# Patient Record
Sex: Female | Born: 1955 | Race: White | Hispanic: No | Marital: Married | Smoking: Never smoker
Health system: Southern US, Community
[De-identification: ages and names within clinical notes are randomized; demographics above are authoritative.]

## PROBLEM LIST (undated history)

## (undated) DIAGNOSIS — I10 Essential (primary) hypertension: Secondary | ICD-10-CM

## (undated) DIAGNOSIS — B0230 Zoster ocular disease, unspecified: Secondary | ICD-10-CM

## (undated) DIAGNOSIS — K5730 Diverticulosis of large intestine without perforation or abscess without bleeding: Secondary | ICD-10-CM

## (undated) HISTORY — DX: Essential (primary) hypertension: I10

## (undated) HISTORY — DX: Zoster ocular disease, unspecified: B02.30

## (undated) HISTORY — DX: Diverticulosis of large intestine without perforation or abscess without bleeding: K57.30

---

## 1990-06-19 HISTORY — PX: BREAST BIOPSY: SHX20

## 1992-06-19 HISTORY — PX: UMBILICAL HERNIA REPAIR: SHX196

## 2009-09-02 ENCOUNTER — Encounter: Admission: RE | Admit: 2009-09-02 | Discharge: 2009-09-02 | Payer: Self-pay | Admitting: Family Medicine

## 2011-03-01 LAB — HM PAP SMEAR

## 2011-03-03 ENCOUNTER — Encounter: Payer: Self-pay | Admitting: Internal Medicine

## 2011-04-04 ENCOUNTER — Other Ambulatory Visit: Payer: Self-pay | Admitting: Family Medicine

## 2011-04-04 DIAGNOSIS — Z1231 Encounter for screening mammogram for malignant neoplasm of breast: Secondary | ICD-10-CM

## 2011-04-13 ENCOUNTER — Other Ambulatory Visit: Payer: Self-pay | Admitting: Internal Medicine

## 2011-04-14 ENCOUNTER — Ambulatory Visit
Admission: RE | Admit: 2011-04-14 | Discharge: 2011-04-14 | Disposition: A | Discharge status: Home / Self Care | Payer: PRIVATE HEALTH INSURANCE | Source: Ambulatory Visit | Attending: Family Medicine | Admitting: Family Medicine

## 2011-04-14 ENCOUNTER — Encounter: Payer: Self-pay | Admitting: Internal Medicine

## 2011-04-14 DIAGNOSIS — Z1231 Encounter for screening mammogram for malignant neoplasm of breast: Secondary | ICD-10-CM

## 2011-04-14 LAB — HM MAMMOGRAPHY

## 2011-04-21 ENCOUNTER — Other Ambulatory Visit: Payer: Self-pay | Admitting: Internal Medicine

## 2011-06-05 ENCOUNTER — Other Ambulatory Visit: Payer: Self-pay | Admitting: Internal Medicine

## 2011-06-20 DIAGNOSIS — K5730 Diverticulosis of large intestine without perforation or abscess without bleeding: Secondary | ICD-10-CM

## 2011-06-20 HISTORY — DX: Diverticulosis of large intestine without perforation or abscess without bleeding: K57.30

## 2011-06-26 ENCOUNTER — Ambulatory Visit (AMBULATORY_SURGERY_CENTER): Payer: PRIVATE HEALTH INSURANCE | Admitting: *Deleted

## 2011-06-26 VITALS — Ht 64.0 in | Wt 187.3 lb

## 2011-06-26 DIAGNOSIS — Z1211 Encounter for screening for malignant neoplasm of colon: Secondary | ICD-10-CM

## 2011-06-26 MED ORDER — PEG-KCL-NACL-NASULF-NA ASC-C 100 G PO SOLR: ORAL | Status: DC

## 2011-07-14 ENCOUNTER — Encounter: Payer: Self-pay | Admitting: Internal Medicine

## 2011-07-14 ENCOUNTER — Ambulatory Visit (AMBULATORY_SURGERY_CENTER): Payer: PRIVATE HEALTH INSURANCE | Admitting: Internal Medicine

## 2011-07-14 VITALS — BP 129/87 | HR 66 | Temp 97.6°F | Resp 18 | Ht 64.0 in | Wt 187.0 lb

## 2011-07-14 DIAGNOSIS — Z1211 Encounter for screening for malignant neoplasm of colon: Secondary | ICD-10-CM

## 2011-07-14 MED ORDER — SODIUM CHLORIDE 0.9 % IV SOLN: 500.0000 mL | INTRAVENOUS | Status: DC

## 2011-07-14 NOTE — Progress Notes (Signed)
Patient did not have preoperative order for IV antibiotic SSI prophylaxis. (G8918)  Patient did not experience any of the following events: a burn prior to discharge; a fall within the facility; wrong site/side/patient/procedure/implant event; or a hospital transfer or hospital admission upon discharge from the facility. (G8907)  

## 2011-07-14 NOTE — Patient Instructions (Signed)
FOLLOW THE INSTRUCTIONS ON THE BLUE AND GREEN DISCHARGE INSTRUCTION SHEETS.  CONTINUE YOUR MEDICATIONS.  HIGH FIBER DIET.

## 2011-07-14 NOTE — Progress Notes (Signed)
Pressure applied to the abdomen to reach the cecum 

## 2011-07-14 NOTE — Op Note (Signed)
[facility] [address] [place], [place]  [address]  COLONOSCOPY PROCEDURE REPORT  PATIENT:  Caitlin Walters, Caitlin Walters  MR#:  [id number] BIRTHDATE:  [birth year], 55 yrs. old  GENDER:  female ENDOSCOPIST:  Hedwig Morton. Juanda Chance, MD REF. BY: PROCEDURE DATE:  07/14/2011 PROCEDURE:  Colonoscopy 19147 ASA CLASS:  Class I INDICATIONS:  colorectal cancer screening, average risk MEDICATIONS:   These medications were titrated to patient response per physician's verbal order, Versed 7 mg, Fentanyl 75 mcg  DESCRIPTION OF PROCEDURE:   After the risks and benefits and of the procedure were explained, informed consent was obtained. Digital rectal exam was performed and revealed no rectal masses. The LB [id number]-[id number] endoscope was introduced through the anus and advanced to the cecum, which was identified by both the appendix and ileocecal valve.  The quality of the prep was good, using MoviPrep.  The instrument was then slowly withdrawn as the colon was fully examined. <<PROCEDUREIMAGES>>  FINDINGS:  Mild diverticulosis was found in the sigmoid colon (see image1).  This was otherwise a normal examination of the colon (see image5, image4, image3, and image2).   Retroflexed views in the rectum revealed no abnormalities.    The scope was then withdrawn from the patient and the procedure completed.  COMPLICATIONS:  None ENDOSCOPIC IMPRESSION: 1) Mild diverticulosis in the sigmoid colon 2) Otherwise normal examination RECOMMENDATIONS: 1) High fiber diet.  REPEAT EXAM:  In 10 year(s) for.  ______________________________ Hedwig Morton. Juanda Chance, MD  CC:  n. eSIGNED:   Hedwig Morton. Jhoselyn Ruffini at 07/14/2011 08:34 AM  Heron Sabins, [phone]

## 2011-07-17 ENCOUNTER — Telehealth: Payer: Self-pay

## 2011-07-17 NOTE — Telephone Encounter (Signed)
Receiver hung up on RN twice

## 2012-11-06 ENCOUNTER — Other Ambulatory Visit: Payer: Self-pay

## 2012-11-06 DIAGNOSIS — Z1231 Encounter for screening mammogram for malignant neoplasm of breast: Secondary | ICD-10-CM

## 2012-11-18 ENCOUNTER — Ambulatory Visit
Admission: RE | Admit: 2012-11-18 | Discharge: 2012-11-18 | Disposition: A | Discharge status: Home / Self Care | Payer: PRIVATE HEALTH INSURANCE | Source: Ambulatory Visit

## 2012-11-18 DIAGNOSIS — Z1231 Encounter for screening mammogram for malignant neoplasm of breast: Secondary | ICD-10-CM

## 2012-11-18 LAB — HM MAMMOGRAPHY

## 2012-11-19 ENCOUNTER — Ambulatory Visit (INDEPENDENT_AMBULATORY_CARE_PROVIDER_SITE_OTHER): Payer: PRIVATE HEALTH INSURANCE | Admitting: Nurse Practitioner

## 2012-11-19 ENCOUNTER — Encounter: Payer: Self-pay | Admitting: Nurse Practitioner

## 2012-11-19 ENCOUNTER — Encounter: Payer: Self-pay | Admitting: *Deleted

## 2012-11-19 VITALS — BP 120/80 | HR 73 | Temp 98.0°F | Ht 63.16 in | Wt 187.2 lb

## 2012-11-19 DIAGNOSIS — Z0000 Encounter for general adult medical examination without abnormal findings: Secondary | ICD-10-CM

## 2012-11-19 DIAGNOSIS — Z7689 Persons encountering health services in other specified circumstances: Secondary | ICD-10-CM

## 2012-11-19 DIAGNOSIS — Z7189 Other specified counseling: Secondary | ICD-10-CM

## 2012-11-19 DIAGNOSIS — Z6832 Body mass index (BMI) 32.0-32.9, adult: Secondary | ICD-10-CM

## 2012-11-19 DIAGNOSIS — R319 Hematuria, unspecified: Secondary | ICD-10-CM

## 2012-11-19 DIAGNOSIS — F329 Major depressive disorder, single episode, unspecified: Secondary | ICD-10-CM | POA: Insufficient documentation

## 2012-11-19 DIAGNOSIS — F4321 Adjustment disorder with depressed mood: Secondary | ICD-10-CM

## 2012-11-19 LAB — POCT URINALYSIS DIPSTICK
Bilirubin, UA: NEGATIVE
Glucose, UA: NEGATIVE
Protein, UA: NEGATIVE

## 2012-11-19 LAB — RENAL FUNCTION PANEL
Creatinine, Ser: 0.7 mg/dL (ref 0.4–1.2)
GFR: 96.79 mL/min (ref >60.00)
Glucose, Bld: 81 mg/dL (ref 70–99)
Phosphorus: 3.8 mg/dL (ref 2.3–4.6)
Sodium: 141 mEq/L (ref 135–145)

## 2012-11-19 LAB — CBC
Hemoglobin: 13.9 g/dL (ref 12.0–15.0)
MCHC: 33.5 g/dL (ref 30.0–36.0)
Platelets: 303 10*3/uL (ref 150.0–400.0)

## 2012-11-19 LAB — LIPID PANEL
LDL Cholesterol: 103 mg/dL — ABNORMAL HIGH (ref 0–99)
Total CHOL/HDL Ratio: 4
Triglycerides: 87 mg/dL (ref 0.0–149.0)
VLDL: 17.4 mg/dL (ref 0.0–40.0)

## 2012-11-19 LAB — HEPATIC FUNCTION PANEL
ALT: 36 U/L — ABNORMAL HIGH (ref 0–35)
AST: 36 U/L (ref 0–37)
Alkaline Phosphatase: 63 U/L (ref 39–117)
Total Bilirubin: 0.5 mg/dL (ref 0.3–1.2)

## 2012-11-19 NOTE — Patient Instructions (Addendum)
We have talked about many things today. We will focus on weight loss through increasing activity and diet: activity goal is 30 minute walk at least 5 days weekly. Diet goal is 1600 calories daily, It is ok to cheat every now & then, but no more than 2100 calories. Make sure you walk on the days you cheat! This plan should drop 1-2 pounds each week.  Regarding situational stress,I think exercise will help and you have resources available for "cognitive behavioral therapy" where you can learn coping skills for stressful events/relationships. I will call you with abnormal labs results. Make an appointment regarding weight loss-if things are not going as planned, or if you want to discuss anxiety/situational depression.  Pleasure to meet you! Come back in September for flu vaccines. You may also get the Tdap & shingles vaccine at that time, if you wish.

## 2012-11-19 NOTE — Progress Notes (Signed)
Patient ID: Caitlin Walters, female   DOB: [birth year], 56 y.o.   MRN: [id number] Subjective:     Caitlin Walters is a 57 y.o. female and is here for a comprehensive physical exam. The patient reports 30 lb. weight gain, situational depression related to family circumstances..  History   Social History  . Marital Status: Married    Spouse Name: Caitlin Walters    Number of Children: 2  . 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 Smoking status: Never Smoker   . Smokeless tobacco: Never Used  . Alcohol Use: No  . Drug Use: No  . Sexually Active: Yes    Birth Control/ Protection: Post-menopausal   Other Topics Concern  . 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 They are from [place], but lived in [place] for long while, moved back to [place] to help son's family with children & wife's illness (brain CA). Son & daughter-in-law divorced 1 year ago, which has caused tremendous grief in family.    Health Maintenance  Topic Date Due  . Pap Smear  12/05/1974  . Tetanus/tdap  12/05/1975  . Influenza Vaccine  02/17/2013  . Mammogram  11/19/2014  . Colonoscopy  07/13/2021    The following portions of the patient's history were reviewed and updated as appropriate: allergies, current medications, past family history, past medical history, past social history, past surgical history and problem list.  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 Gastrointestinal: negative, most recent colonoscopy shows mild diverticulosis. pt has not had any symptoms Behavioral/Psych: negative, pt reports feelings of sadness and the desire to be reclusive. She thinks this is r/t her son's divorce 1 year ago.    Objective:    BP 120/80  Pulse 73  Temp(Src) 98 F  (36.7 C) (Oral)  Ht 5' 3.16" (1.604 m)  Wt 187 lb 4 oz (84.936 kg)  BMI 33.01 kg/m2  SpO2 97% General appearance: alert, cooperative, appears stated age and no distress Head: Normocephalic, without obvious abnormality, atraumatic Eyes: negative findings: lids and lashes normal, corneas clear and pupils equal, round, reactive to light and accomodation, positive findings: conjunctiva: L eye injection Ears: normal TM's and external ear canals both ears Throat: lips, mucosa, and tongue normal; teeth and gums normal and torus palatinus  Neck: no adenopathy, supple, symmetrical, trachea midline and thyroid not enlarged, symmetric, no tenderness/mass/nodules Lungs: clear to auscultation bilaterally Heart: regular rate and rhythm, S1, S2 normal, no murmur, click, rub or gallop and normal apical impulse Extremities: extremities normal, atraumatic, no cyanosis or edema Pulses: 2+ and symmetric Lymph nodes: Cervical, supraclavicular, and axillary nodes normal.    Assessment:   BMI 32.0-32.9,adult - Plan: Lipid panel, TSH, CBC, Hepatic function panel, Renal function panel, POCT urinalysis dipstick  Establishing care with new doctor, encounter for  Situational depression    Plan:    1. See plan for weight loss in pt instructions 2. Will request medical records to determine vaccine needs. Last PAP  2013-normal per pt report. 3.Pt has friend who is behavioral therapist, also friends in ministry who she confides with. Plan: if these resources and exercise are not helpful, we will discuss adding an ssri/snri.  [email or web address] See After Visit Summary for Counseling Recommendations

## 2012-11-26 LAB — CBC WITH DIFFERENTIAL/PLATELET
BASO%: 1 %
EOS%: 4 %
Eosinophils Absolute: 0 /uL
Granulocytes:: 51
HCT: 41 %
LYMPH: 37 %
Lymphocytes absolute: 2.1 10*3/uL — AB (ref 0.1–1.8)
MONO: 7 /uL
Monocyes absolute: 0.4 10*3/uL (ref 0.1–1)

## 2012-11-26 LAB — COMPLETE METABOLIC PANEL WITH GFR
Albumin: 4.5
Alkaline Phosphatase: 84 U/L
CO2: 27 mmol/L
Chloride: 104 mmol/L
Creatinine: 0.7
Glucose: 99 mg/dL
Potassium: 4.6 mmol/L
Total Bilirubin: 0.5 mg/dL

## 2012-11-26 LAB — LIPID PANEL
HDL: 54 mg/dL (ref 35–70)
LDL Calculated: 174 mg/dL
TSH: 1.312
Triglycerides: 123

## 2013-04-24 ENCOUNTER — Other Ambulatory Visit: Payer: Self-pay

## 2013-08-08 ENCOUNTER — Ambulatory Visit (INDEPENDENT_AMBULATORY_CARE_PROVIDER_SITE_OTHER): Payer: PRIVATE HEALTH INSURANCE | Admitting: Nurse Practitioner

## 2013-08-08 ENCOUNTER — Encounter: Payer: Self-pay | Admitting: Nurse Practitioner

## 2013-08-08 VITALS — BP 137/85 | HR 80 | Temp 98.9°F | Resp 16 | Ht 63.16 in | Wt 193.0 lb

## 2013-08-08 DIAGNOSIS — R030 Elevated blood-pressure reading, without diagnosis of hypertension: Secondary | ICD-10-CM

## 2013-08-08 DIAGNOSIS — F4321 Adjustment disorder with depressed mood: Secondary | ICD-10-CM

## 2013-08-08 DIAGNOSIS — IMO0001 Reserved for inherently not codable concepts without codable children: Secondary | ICD-10-CM

## 2013-08-08 MED ORDER — LISINOPRIL 10 MG PO TABS: 10.0000 mg | ORAL_TABLET | Freq: Every day | ORAL | Status: DC

## 2013-08-08 NOTE — Progress Notes (Signed)
Pre visit review using our clinic review tool, if applicable. No additional management support is needed unless otherwise documented below in the visit note. 

## 2013-08-08 NOTE — Patient Instructions (Signed)
Stop hydrochlorothiazide & potassium supplement. Start lisinopril tomorrow. If not affordable, please call office.  Read about the DASH diet for healthy eating tips-it helps w/blood pressure & weight loss.  F/u in 1 week for bp check & eval meds. So nice to see you!  DASH Diet The DASH diet stands for "Dietary Approaches to Stop Hypertension." It is a healthy eating plan that has been shown to reduce high blood pressure (hypertension) in as little as 14 days, while also possibly providing other significant health benefits. These other health benefits include reducing the risk of breast cancer after menopause and reducing the risk of type 2 diabetes, heart disease, colon cancer, and stroke. Health benefits also include weight loss and slowing kidney failure in patients with chronic kidney disease.  DIET GUIDELINES  Limit salt (sodium). Your diet should contain less than 1500 mg of sodium daily.  Limit refined or processed carbohydrates. Your diet should include mostly whole grains. Desserts and added sugars should be used sparingly.  Include small amounts of heart-healthy fats. These types of fats include nuts, oils, and tub margarine. Limit saturated and trans fats. These fats have been shown to be harmful in the body. CHOOSING FOODS  The following food groups are based on a 2000 calorie diet. See your Registered Dietitian for individual calorie needs. Grains and Grain Products (6 to 8 servings daily)  Eat More Often: Whole-wheat bread, brown rice, whole-grain or wheat pasta, quinoa, popcorn without added fat or salt (air popped).  Eat Less Often: White bread, white pasta, white rice, cornbread. Vegetables (4 to 5 servings daily)  Eat More Often: Fresh, frozen, and canned vegetables. Vegetables may be raw, steamed, roasted, or grilled with a minimal amount of fat.  Eat Less Often/Avoid: Creamed or fried vegetables. Vegetables in a cheese sauce. Fruit (4 to 5 servings daily)  Eat More  Often: All fresh, canned (in natural juice), or frozen fruits. Dried fruits without added sugar. One hundred percent fruit juice ( cup [237 mL] daily).  Eat Less Often: Dried fruits with added sugar. Canned fruit in light or heavy syrup. Foot LockerLean Meats, Fish, and Poultry (2 servings or less daily. One serving is 3 to 4 oz [85-114 g]).  Eat More Often: Ninety percent or leaner ground beef, tenderloin, sirloin. Round cuts of beef, chicken breast, Malaw[place]urkey breast. All fish. Grill, bake, or broil your meat. Nothing should be fried.  Eat Less Often/Avoid: Fatty cuts of meat, Malaw[place]key, or chicken leg, thigh, or wing. Fried cuts of meat or fish. Dairy (2 to 3 servings)  Eat More Often: Low-fat or fat-free milk, low-fat plain or light yogurt, reduced-fat or part-skim cheese.  Eat Less Often/Avoid: Milk (whole, 2%).Whole milk yogurt. Full-fat cheeses. Nuts, Seeds, and Legumes (4 to 5 servings per week)  Eat More Often: All without added salt.  Eat Less Often/Avoid: Salted nuts and seeds, canned beans with added salt. Fats and Sweets (limited)  Eat More Often: Vegetable oils, tub margarines without trans fats, sugar-free gelatin. Mayonnaise and salad dressings.  Eat Less Often/Avoid: Coconut oils, palm oils, butter, stick margarine, cream, half and half, cookies, candy, pie. FOR MORE INFORMATION The Dash Diet Eating Plan: www.dashdiet.org Document Released: 05/25/2011 Document Revised: 08/28/2011 Document Reviewed: 05/25/2011 [facility] Patient Information 2014 [place], [place].

## 2013-08-11 ENCOUNTER — Telehealth: Payer: Self-pay | Admitting: Nurse Practitioner

## 2013-08-11 DIAGNOSIS — R030 Elevated blood-pressure reading, without diagnosis of hypertension: Principal | ICD-10-CM

## 2013-08-11 DIAGNOSIS — IMO0001 Reserved for inherently not codable concepts without codable children: Secondary | ICD-10-CM | POA: Insufficient documentation

## 2013-08-11 NOTE — Progress Notes (Signed)
Subjective:    Patient here for hospital follow-up. I reviewed [facility] hospital record including labs, CXR, echocardiogram, stress test.  Dx was hypokalemia. Pt experienced palpitations-thought she was having a heart attack. [facility] K was 3.1. Corrected w/oral potassium. Cardiac enzymes neg, ECG showed SR w/long QT, echo nml, exercise stress test nml sr, but she had resting hypertension when activity was stopped.  Pt was seen for first time in ofc 11/2012 to establish care and reported several stressful life events but did not want to start medication (SSRI). She was not hypertensive, nor was she on HCTZ. Pt thinks she got the medicine at our office, but there is no script nor reference.     She reports stress in recent months with her mother falling & coming to live with her, then going back home; & some job/financial concerns.    The following portions of the patient's history were reviewed and updated as appropriate: allergies, current medications, past medical history, past social history, past surgical history and problem list.  Review of Systems Constitutional: negative for chills, fatigue and fevers Respiratory: negative for cough Cardiovascular: all symptoms resolved sonce potassium corrected. Behavioral/Psych: positive for depression     Objective:    BP 137/85  Pulse 80  Temp(Src) 98.9 F (37.2 C) (Temporal)  Resp 16  Ht 5' 3.16" (1.604 m)  Wt 193 lb (87.544 kg)  BMI 34.03 kg/m2  SpO2 95% General appearance: alert, cooperative, appears stated age and no distress Head: Normocephalic, without obvious abnormality, atraumatic Eyes: negative findings: lids and lashes normal and conjunctivae and sclerae normal Lungs: clear to auscultation bilaterally Heart: regular rate and rhythm, S1, S2 normal, no murmur, click, rub or gallop    Assessment & Plan:  1 Elevated blood pressure: fair control on HCTZ. Recent hospitalization for hypokalemia (NSR w/long QT) resolved with potassium  restoration. Resting hypertension after cardiac stress test.  D/C HCTZ. Start lisinopril 10 mg qd. F/u in 10 days. Pt planning to exercise several days/week.  2 Situational depression Pt wants to try exercise. If no relief will consider low dose fluoxetine.

## 2013-08-11 NOTE — Telephone Encounter (Signed)
Relevant patient education assigned to patient using Emmi. ° °

## 2013-08-11 NOTE — Assessment & Plan Note (Signed)
Pt hoping to feel better with exercise. Will consider fluoxetine if no relief.

## 2013-08-19 ENCOUNTER — Ambulatory Visit (INDEPENDENT_AMBULATORY_CARE_PROVIDER_SITE_OTHER): Payer: PRIVATE HEALTH INSURANCE | Admitting: Nurse Practitioner

## 2013-08-19 ENCOUNTER — Encounter: Payer: Self-pay | Admitting: Nurse Practitioner

## 2013-08-19 VITALS — BP 130/70 | HR 67 | Temp 98.0°F | Ht 63.16 in | Wt 192.5 lb

## 2013-08-19 DIAGNOSIS — F4321 Adjustment disorder with depressed mood: Secondary | ICD-10-CM

## 2013-08-19 DIAGNOSIS — Z6832 Body mass index (BMI) 32.0-32.9, adult: Secondary | ICD-10-CM

## 2013-08-19 DIAGNOSIS — I10 Essential (primary) hypertension: Secondary | ICD-10-CM

## 2013-08-19 DIAGNOSIS — R030 Elevated blood-pressure reading, without diagnosis of hypertension: Secondary | ICD-10-CM

## 2013-08-19 DIAGNOSIS — IMO0001 Reserved for inherently not codable concepts without codable children: Secondary | ICD-10-CM

## 2013-08-19 DIAGNOSIS — Z130 Encounter for screening for diseases of the blood and blood-forming organs and certain disorders involving the immune mechanism: Secondary | ICD-10-CM

## 2013-08-19 DIAGNOSIS — Z1321 Encounter for screening for nutritional disorder: Secondary | ICD-10-CM

## 2013-08-19 DIAGNOSIS — Z13228 Encounter for screening for other metabolic disorders: Secondary | ICD-10-CM

## 2013-08-19 DIAGNOSIS — Z1329 Encounter for screening for other suspected endocrine disorder: Secondary | ICD-10-CM

## 2013-08-19 LAB — BASIC METABOLIC PANEL
BUN: 15 mg/dL (ref 6–23)
CO2: 28 mEq/L (ref 19–32)
Calcium: 9.4 mg/dL (ref 8.4–10.5)
Chloride: 103 mEq/L (ref 96–112)
Creatinine, Ser: 0.7 mg/dL (ref 0.4–1.2)
GFR: 99.96 mL/min (ref >60.00)
GLUCOSE: 78 mg/dL (ref 70–99)
POTASSIUM: 4.4 meq/L (ref 3.5–5.1)
SODIUM: 137 meq/L (ref 135–145)

## 2013-08-19 NOTE — Patient Instructions (Signed)
I am not sure if cough is related to lisinopril. Start daily sinus rinses (Neilmed) to see if symptoms may be allergy related. Continue medication for 3-4 more weeks & see if symptoms improve. If not, call office & we can discuss changing med. Our office will call regarding labs today. Great to see you! Keep exercising for best health.  Managing Your High Blood Pressure Blood pressure is a measurement of how forceful your blood is pressing against the walls of the arteries. Arteries are muscular tubes within the circulatory system. Blood pressure does not stay the same. Blood pressure rises when you are active, excited, or nervous; and it lowers during sleep and relaxation. If the numbers measuring your blood pressure stay above normal most of the time, you are at risk for health problems. High blood pressure (hypertension) is a long-term (chronic) condition in which blood pressure is elevated. A blood pressure reading is recorded as two numbers, such as 120 over 80 (or 120/80). The first, higher number is called the systolic pressure. It is a measure of the pressure in your arteries as the heart beats. The second, lower number is called the diastolic pressure. It is a measure of the pressure in your arteries as the heart relaxes between beats.  Keeping your blood pressure in a normal range is important to your overall health and prevention of health problems, such as heart disease and stroke. When your blood pressure is uncontrolled, your heart has to work harder than normal. High blood pressure is a very common condition in adults because blood pressure tends to rise with age. Men and women are equally likely to have hypertension but at different times in life. Before age 57, men are more likely to have hypertension. After 57 years of age, women are more likely to have it. Hypertension is especially common in African Americans. This condition often has no signs or symptoms. The cause of the condition is  usually not known. Your caregiver can help you come up with a plan to keep your blood pressure in a normal, healthy range. BLOOD PRESSURE STAGES Blood pressure is classified into four stages: normal, prehypertension, stage 1, and stage 2. Your blood pressure reading will be used to determine what type of treatment, if any, is necessary. Appropriate treatment options are tied to these four stages:  Normal  Systolic pressure (mm Hg): below 120.  Diastolic pressure (mm Hg): below 80. Prehypertension  Systolic pressure (mm Hg): 120 to 139.  Diastolic pressure (mm Hg): 80 to 89. Stage1  Systolic pressure (mm Hg): 140 to 159.  Diastolic pressure (mm Hg): 90 to 99. Stage2  Systolic pressure (mm Hg): 160 or above.  Diastolic pressure (mm Hg): 100 or above. RISKS RELATED TO HIGH BLOOD PRESSURE Managing your blood pressure is an important responsibility. Uncontrolled high blood pressure can lead to:  A heart attack.  A stroke.  A weakened blood vessel (aneurysm).  Heart failure.  Kidney damage.  Eye damage.  Metabolic syndrome.  Memory and concentration problems. HOW TO MANAGE YOUR BLOOD PRESSURE Blood pressure can be managed effectively with lifestyle changes and medicines (if needed). Your caregiver will help you come up with a plan to bring your blood pressure within a normal range. Your plan should include the following: Education  Read all information provided by your caregivers about how to control blood pressure.  Educate yourself on the latest guidelines and treatment recommendations. New research is always being done to further define the risks and treatments for  high blood pressure. Lifestylechanges  Control your weight.  Avoid smoking.  Stay physically active.  Reduce the amount of salt in your diet.  Reduce stress.  Control any chronic conditions, such as high cholesterol or diabetes.  Reduce your alcohol intake. Medicines  Several medicines  (antihypertensive medicines) are available, if needed, to bring blood pressure within a normal range. Communication  Review all the medicines you take with your caregiver because there may be side effects or interactions.  Talk with your caregiver about your diet, exercise habits, and other lifestyle factors that may be contributing to high blood pressure.  See your caregiver regularly. Your caregiver can help you create and adjust your plan for managing high blood pressure. RECOMMENDATIONS FOR TREATMENT AND FOLLOW-UP  The following recommendations are based on current guidelines for managing high blood pressure in nonpregnant adults. Use these recommendations to identify the proper follow-up period or treatment option based on your blood pressure reading. You can discuss these options with your caregiver.  Systolic pressure of 120 to 139 or diastolic pressure of 80 to 89: Follow up with your caregiver as directed.  Systolic pressure of 140 to 160 or diastolic pressure of 90 to 100: Follow up with your caregiver within 2 months.  Systolic pressure above 160 or diastolic pressure above 100: Follow up with your caregiver within 1 month.  Systolic pressure above 180 or diastolic pressure above 110: Consider antihypertensive therapy; follow up with your caregiver within 1 week.  Systolic pressure above 200 or diastolic pressure above 120: Begin antihypertensive therapy; follow up with your caregiver within 1 week. Document Released: 02/28/2012 Document Reviewed: 02/28/2012 [facility] Patient Information 2014 [place], [place].

## 2013-08-19 NOTE — Progress Notes (Signed)
Pre visit review using our clinic review tool, if applicable. No additional management support is needed unless otherwise documented below in the visit note. 

## 2013-08-20 ENCOUNTER — Telehealth: Payer: Self-pay | Admitting: Nurse Practitioner

## 2013-08-20 LAB — VITAMIN D 25 HYDROXY (VIT D DEFICIENCY, FRACTURES): Vit D, 25-Hydroxy: 47 ng/mL (ref 30–89)

## 2013-08-20 NOTE — Telephone Encounter (Signed)
Relevant patient education assigned to patient using Emmi. ° °

## 2013-08-22 NOTE — Assessment & Plan Note (Signed)
D/c HCTZ due to hyperkalemia & persistent elevated bp [facility]  Started lisinopril 10 mg qd.  Pt returns 1 wk later. BP well controlled. Tolerating med well. Will continue.  BMET today.

## 2013-08-22 NOTE — Assessment & Plan Note (Addendum)
Lost 1 lb. In 1 wk w/exercise. Will continue.

## 2013-08-22 NOTE — Progress Notes (Signed)
Subjective:    Patient here for follow-up of elevated blood pressure. She presented 1 weeks ago [facility]. Pt was taking 12.5 mg HCTZ prescribed by historical provider. [facility] she had persistent BP elevation & HR. I D?C HCTZ & started lisinopril.  She has started exercise program at [employer]. Blood pressure is well controlled at home. Cardiac symptoms: none. Patient denies: chest pain, fatigue, irregular heart beat, lower extremity edema, near-syncope and palpitations. Cardiovascular risk factors: family history of premature cardiovascular disease, hypertension, obesity (BMI >= 30 kg/m2) and sedentary lifestyle. Use of agents associated with hypertension: none. History of target organ damage: none.  The following portions of the patient's history were reviewed and updated as appropriate: allergies, current medications, past family history, past medical history, past social history, past surgical history and problem list.  Review of Systems Constitutional: negative Eyes: negative for visual disturbance Respiratory: negative for cough and wheezing Cardiovascular: positive for cp when potassium was low "thought I was having heart attaack", negative for fatigue, lower extremity edema, near-syncope and palpitations Behavioral/Psych: positive for depression, negative for decreased appetite, excessive alcohol consumption, illegal drug usage and tobacco use     Objective:    BP 130/70  Pulse 67  Temp(Src) 98 F (36.7 C) (Oral)  Ht 5' 3.16" (1.604 m)  Wt 192 lb 8 oz (87.317 kg)  BMI 33.94 kg/m2  SpO2 97% General appearance: alert, cooperative, appears stated age and no distress Head: Normocephalic, without obvious abnormality, atraumatic Eyes: negative findings: lids and lashes normal and conjunctivae and sclerae normal Lungs: clear to auscultation bilaterally Heart: regular rate and rhythm, S1, S2 normal, no murmur, click, rub or  gallop Extremities: extremities normal, atraumatic, no cyanosis or edema Pulses: 2+ and symmetric    Assessment:    Hypertension, normal blood pressure . Evidence of target organ damage: none.   situational depression BMI >30: lost 1 lb/wk Prev care; Vit D screen Plan:   1 continue lisinopril 10 mg. bmet today. Cont exercise 2 exercise 3 exercise 4 vit d today See prob list for A&P details.

## 2013-08-22 NOTE — Assessment & Plan Note (Signed)
Does not wish to use medication. Wants to use exercise for mood elevation & coping with stress. Will let me know if not feeling better.

## 2013-10-09 LAB — LIPID PANEL
Cholesterol, Total: 221
HDL: 55 mg/dL (ref 35–70)
LDL CALC: 135
MAGNESIUM: 2 mg/dL (ref 1.6–2.4)
Triglycerides: 154

## 2013-10-09 LAB — BASIC METABOLIC PANEL
Anion gap: 11
BUN/Creatinine Ratio: 22
BUN: 13 mg/dL (ref 4–21)
CO2: 27 mmol/L
Calcium: 9 mg/dL
Chloride: 102 mmol/L
Creat: 0.6
EGFR: 60 mg/dL
Glucose: 101
Potassium: 3.7 mmol/L
SODIUM: 136 mmol/L — AB (ref 137–147)

## 2013-10-09 LAB — CBC WITH DIFFERENTIAL/PLATELET
BASOPHIL: 0.05
BASOPHILS PERCENT AUTO: 0.6
Eosinophil Count, Nasal: 0.23
Eosinophil percent: 2.6
HCT: 0.44
Hemoglobin: 15.2 g/dL (ref 11.8–15.5)
Lymphocyte count, blood: 2.8
Lymphocytes relative %: 32.1 % (ref 15–45)
MCH: 30
MCHC: 34.4
MCV: 87 fL (ref 78–100)
MONOCYTES RELATIVE % (KUC): 6.1 % (ref 2–10)
MPV (KUC): 9.9 fL (ref 7.8–11)
Manual nRBC per 100 Cells: 0
Monocyte Count, blood: 0.5
Neutrophils absolute (GR#): 5.1 10*3/uL (ref 1.7–7.8)
Neutrophils relative % (GR): 58.6 % (ref 44–76)
PLATELET COUNT (KUC): 353 10*3/uL (ref 140–400)
RBC: 5.06
RDW: 13.7
WBC: 8.7
nRBC#: 0

## 2013-10-09 LAB — CHG BLOOD CLOT FACTOR X TEST
CK MB: 2 (ref 0.0–3.5)
CK Total: 59
Potassium: 3.1 mmol/L
Troponin T: 0.01 ng/mL

## 2013-10-23 ENCOUNTER — Ambulatory Visit (INDEPENDENT_AMBULATORY_CARE_PROVIDER_SITE_OTHER): Payer: Self-pay | Admitting: Nurse Practitioner

## 2013-10-23 ENCOUNTER — Encounter: Payer: Self-pay | Admitting: Nurse Practitioner

## 2013-10-23 ENCOUNTER — Other Ambulatory Visit (HOSPITAL_COMMUNITY)
Admission: RE | Admit: 2013-10-23 | Discharge: 2013-10-23 | Disposition: A | Discharge status: Home / Self Care | Payer: PRIVATE HEALTH INSURANCE | Source: Ambulatory Visit | Attending: Nurse Practitioner | Admitting: Nurse Practitioner

## 2013-10-23 ENCOUNTER — Telehealth: Payer: Self-pay | Admitting: Nurse Practitioner

## 2013-10-23 VITALS — BP 128/84 | HR 76 | Temp 97.6°F | Ht 63.16 in | Wt 191.0 lb

## 2013-10-23 DIAGNOSIS — Z01419 Encounter for gynecological examination (general) (routine) without abnormal findings: Secondary | ICD-10-CM | POA: Insufficient documentation

## 2013-10-23 DIAGNOSIS — I10 Essential (primary) hypertension: Secondary | ICD-10-CM

## 2013-10-23 DIAGNOSIS — F4323 Adjustment disorder with mixed anxiety and depressed mood: Secondary | ICD-10-CM

## 2013-10-23 DIAGNOSIS — N8111 Cystocele, midline: Secondary | ICD-10-CM

## 2013-10-23 DIAGNOSIS — IMO0002 Reserved for concepts with insufficient information to code with codable children: Secondary | ICD-10-CM

## 2013-10-23 DIAGNOSIS — F4321 Adjustment disorder with depressed mood: Secondary | ICD-10-CM

## 2013-10-23 DIAGNOSIS — IMO0001 Reserved for inherently not codable concepts without codable children: Secondary | ICD-10-CM

## 2013-10-23 DIAGNOSIS — Z1151 Encounter for screening for human papillomavirus (HPV): Secondary | ICD-10-CM | POA: Insufficient documentation

## 2013-10-23 DIAGNOSIS — R030 Elevated blood-pressure reading, without diagnosis of hypertension: Secondary | ICD-10-CM

## 2013-10-23 MED ORDER — PROPRANOLOL HCL 20 MG PO TABS: 40.0000 mg | ORAL_TABLET | Freq: Every day | ORAL | Status: DC

## 2013-10-23 NOTE — Patient Instructions (Addendum)
Start new Blood pressure medication. It may help with anxiety as well. Return in 2 weeks to see how you are tolerating. You have mild bladder prolapse. Empty bladder every 2 hours to prevent bladder distension. Kegel kegel kegel!! Hold 10 sec, repeat 10 times, 10 times daily. Propranolol tablets What is this medicine? PROPRANOLOL (proe PRAN oh lole) is a beta-blocker. Beta-blockers reduce the workload on the heart and help it to beat more regularly. This medicine is used to treat high blood pressure, to control irregular heart rhythms (arrhythmias) and to relieve chest pain caused by angina. It may also be helpful after a heart attack. This medicine is also used to prevent migraine headaches, relieve uncontrollable shaking (tremors), and help certain problems related to the thyroid gland and adrenal gland. This medicine may be used for other purposes; ask your health care provider or pharmacist if you have questions. COMMON BRAND NAME(S): Inderal What should I tell my health care provider before I take this medicine? They need to know if you have any of these conditions: -circulation problems or blood vessel disease -diabetes -history of heart attack or heart disease, vasospastic angina -kidney disease -liver disease -lung or breathing disease, like asthma or emphysema -pheochromocytoma -slow heart rate -thyroid disease -an unusual or allergic reaction to propranolol, other beta-blockers, medicines, foods, dyes, or preservatives -pregnant or trying to get pregnant -breast-feeding How should I use this medicine? Take this medicine by mouth with a glass of water. Follow the directions on the prescription label. Take your doses at regular intervals. Do not take your medicine more often than directed. Do not stop taking except on your the advice of your doctor or health care professional. Talk to your pediatrician regarding the use of this medicine in children. Special care may be  needed. Overdosage: If you think you have taken too much of this medicine contact a poison control center or emergency room at once. NOTE: This medicine is only for you. Do not share this medicine with others. What if I miss a dose? If you miss a dose, take it as soon as you can. If it is almost time for your next dose, take only that dose. Do not take double or extra doses. What may interact with this medicine? Do not take this medicine with any of the following medications: -feverfew -phenothiazines like chlorpromazine, mesoridazine, prochlorperazine, thioridazine  This medicine may also interact with the following medications: -aluminum hydroxide gel -antipyrine -antiviral medicines for HIV or AIDS -barbiturates like phenobarbital -certain medicines for blood pressure, heart disease, irregular heart beat -cimetidine -ciprofloxacin -diazepam -fluconazole -haloperidol -isoniazid -medicines for cholesterol like cholestyramine or colestipol -medicines for mental depression -medicines for migraine headache like almotriptan, eletriptan, frovatriptan, naratriptan, rizatriptan, sumatriptan, zolmitriptan -NSAIDs, medicines for pain and inflammation, like ibuprofen or naproxen -phenytoin -rifampin -teniposide -theophylline -thyroid medicines -tolbutamide -warfarin -zileuton This list may not describe all possible interactions. Give your health care provider a list of all the medicines, herbs, non-prescription drugs, or dietary supplements you use. Also tell them if you smoke, drink alcohol, or use illegal drugs. Some items may interact with your medicine. What should I watch for while using this medicine? Visit your doctor or health care professional for regular check ups. Check your blood pressure and pulse rate regularly. Ask your health care professional what your blood pressure and pulse rate should be, and when you should contact them. You may get drowsy or dizzy. Do not drive, use  machinery, or do anything that needs mental alertness until  you know how this drug affects you. Do not stand or sit up quickly, especially if you are an older patient. This reduces the risk of dizzy or fainting spells. Alcohol can make you more drowsy and dizzy. Avoid alcoholic drinks. This medicine can affect blood sugar levels. If you have diabetes, check with your doctor or health care professional before you change your diet or the dose of your diabetic medicine. Do not treat yourself for coughs, colds, or pain while you are taking this medicine without asking your doctor or health care professional for advice. Some ingredients may increase your blood pressure. What side effects may I notice from receiving this medicine? Side effects that you should report to your doctor or health care professional as soon as possible: -allergic reactions like skin rash, itching or hives, swelling of the face, lips, or tongue -breathing problems -changes in blood sugar -cold hands or feet -difficulty sleeping, nightmares -dry peeling skin -hallucinations -muscle cramps or weakness -slow heart rate -swelling of the legs and ankles -vomiting Side effects that usually do not require medical attention (report to your doctor or health care professional if they continue or are bothersome): -change in sex drive or performance -diarrhea -dry sore eyes -hair loss -nausea -weak or tired This list may not describe all possible side effects. Call your doctor for medical advice about side effects. You may report side effects to FDA at 1-800-FDA-1088. Where should I keep my medicine? Keep out of the reach of children. Store at room temperature between 15 and 30 degrees C (59 and 86 degrees F). Protect from light. Throw away any unused medicine after the expiration date. NOTE: This sheet is a summary. It may not cover all possible information. If you have questions about this medicine, talk to your doctor,  pharmacist, or health care provider.  2014, Elsevier/Gold Standard. (2013-02-07 14:51:53)  Cystocele Repair Cystocele repair is surgery to remove a cystocele, which is a bulging, drooping area (hernia) of the bladder that extends into the vagina. This bulging occurs on the top front wall of the vagina. LET [facility] CARE PROVIDER KNOW ABOUT:   Any allergies you have.  All medicines you are taking, including vitamins, herbs, eye drops, creams, and over-the-counter medicines.  Use of steroids (by mouth or creams).  Previous problems you or members of your family have had with the use of anesthetics.  Any blood disorders you have.  Previous surgeries you have had.  Medical conditions you have.  Possibility of pregnancy, if this applies. RISKS AND COMPLICATIONS  Generally, this is a safe procedure. However, as with any procedure, complications can occur. Possible complications include:  Excessive bleeding.  Infection.  Injury to surrounding structures.  Problems related to anesthetics. The risks will vary depending on the type of anesthetic given.  Problems with the urinary catheter after surgery, such as blockage.  Return of the cystocele. BEFORE THE PROCEDURE   Ask your health care provider about changing or stopping your regular medicines. You may need to stop taking certain medicines 1 week before surgery.  Do not eat or drink anything after midnight the night before surgery.  If you smoke, do not smoke for at least 2 weeks before the surgery.  Do not drink any alcohol for 3 days before the surgery.  Arrange for someone to drive you home after your hospital stay and to help you with activities during recovery. PROCEDURE   You will be given a medicine that makes you sleep through the procedure (  general anesthetic) or a medicine injected into your spine that numbs your body below the waist (spinal or epidural anesthetic). You will be asleep or be numbed through the  entire procedure.  A thin, flexible tube (Foley catheter) will be placed in your bladder to drain urine during and after the surgery.  The surgery is performed through the vagina. The front wall of the vagina is opened up, and the muscle between the bladder and vagina is pulled up to its normal position. This is reinforced with stitches or a piece of mesh. This removes the hernia so that the top of the vagina does not fall into the opening of the vagina.  The cut on the front wall of the vagina is then closed with absorbable stitches that do not need to be removed. AFTER THE PROCEDURE   You will be taken to a recovery area where your progress will be closely watched. Your breathing, blood pressure, and pulse (vital signs) will be checked often. When you are stable, you will be taken to a regular hospital room.  You will have a catheter in place to drain your bladder. This will stay in place for 2 7 days or until your bladder is working properly on its own.  You may have gauze packing in the vagina. This will be removed 1 2 days after the surgery.  You will be given pain medicine as needed and may be given a medicine that kills germs (antibiotic).  You will likely need to stay in the hospital for 1 2 days. Document Released: 06/02/2000 Document Revised: 02/05/2013 Document Reviewed: 11/22/2012 [facility] Patient Information 2014 [place], [place].  Kegel Exercises The goal of Kegel exercises is to isolate and exercise your pelvic floor muscles. These muscles act as a hammock that supports the rectum, vagina, small intestine, and uterus. As the muscles weaken, the hammock sags and these organs are displaced from their normal positions. Kegel exercises can strengthen your pelvic floor muscles and help you to improve bladder and bowel control, improve sexual response, and help reduce many problems and some discomfort during pregnancy. Kegel exercises can be done anywhere and at any time. HOW TO  PERFORM KEGEL EXERCISES 1. Locate your pelvic floor muscles. To do this, squeeze (contract) the muscles that you use when you try to stop the flow of urine. You will feel a tightness in the vaginal area (women) and a tight lift in the rectal area (men and women). 2. When you begin, contract your pelvic muscles tight for 2 5 seconds, then relax them for 2 5 seconds. This is one set. Do 4 5 sets with a short pause in between. 3. Contract your pelvic muscles for 8 10 seconds, then relax them for 8 10 seconds. Do 4 5 sets. If you cannot contract your pelvic muscles for 8 10 seconds, try 5 7 seconds and work your way up to 8 10 seconds. Your goal is 4 5 sets of 10 contractions each day. Keep your stomach, buttocks, and legs relaxed during the exercises. Perform sets of both short and long contractions. Vary your positions. Perform these contractions 3 4 times per day. Perform sets while you are:   Lying in bed in the morning.  Standing at lunch.  Sitting in the late afternoon.  Lying in bed at night. You should do 40 50 contractions per day. Do not perform more Kegel exercises per day than recommended. Overexercising can cause muscle fatigue. Continue these exercises for for at least 15 20  weeks or as directed by your caregiver. Document Released: 05/22/2012 Document Reviewed: 05/22/2012 [facility] Patient Information 2014 [place].

## 2013-10-23 NOTE — Telephone Encounter (Signed)
Patient would like to know if there is a OTC medication she can use for itchy, watery eyes. Patient also wanted to know if there was a cardiologist you could recommend for her mom (her mom has a new patient appt scheduled with you 11/03/13 Irene Papona Bailey).

## 2013-10-23 NOTE — Progress Notes (Signed)
Pre visit review using our clinic review tool, if applicable. No additional management support is needed unless otherwise documented below in the visit note. 

## 2013-10-24 ENCOUNTER — Other Ambulatory Visit: Payer: Self-pay | Admitting: Nurse Practitioner

## 2013-10-24 DIAGNOSIS — H1010 Acute atopic conjunctivitis, unspecified eye: Secondary | ICD-10-CM

## 2013-10-24 MED ORDER — OLOPATADINE HCL 0.1 % OP SOLN
1.0000 [drp] | Freq: Every day | OPHTHALMIC | Status: DC
Start: 2013-10-24 — End: 2014-09-10

## 2013-10-24 NOTE — Telephone Encounter (Signed)
I am prescribing allergy eye drop. PLs advise pt.

## 2013-10-24 NOTE — Telephone Encounter (Signed)
Spoke with pt, advised Rx will be sent to her pharmacy.

## 2013-10-27 ENCOUNTER — Telehealth: Payer: Self-pay | Admitting: Nurse Practitioner

## 2013-10-27 DIAGNOSIS — IMO0002 Reserved for concepts with insufficient information to code with codable children: Secondary | ICD-10-CM | POA: Insufficient documentation

## 2013-10-27 DIAGNOSIS — Z01419 Encounter for gynecological examination (general) (routine) without abnormal findings: Secondary | ICD-10-CM | POA: Insufficient documentation

## 2013-10-27 NOTE — Assessment & Plan Note (Signed)
Mild. Occasional urinary incontinence. Pt does not want surgical intervention. Kegels 10 reps, hold ea. 10 sec, reepeat 10 times daily. Bladder training: empty bladder q2h to prevent distension.

## 2013-10-27 NOTE — Progress Notes (Signed)
Subjective:     Caitlin Walters is a 58 y.o. female presents for routine pelvic exam. She mentions vaginal pressure and occasional urinary incontinence. Also, she has developed cough since starting ACEI. SHe recently learned her mother has cough in response to ACEI. She is continuing to feel stressed w/homelife-her mother recently moved in. She is accepting that this is new "normal" and is making adjustments. Exercise is not solution because she is not consistent. She is not sure she wants to start anti-depressant.  The following portions of the patient's history were reviewed and updated as appropriate: allergies, current medications, past family history, past medical history, past social history, past surgical history and problem list.  Review of Systems Pertinent items are noted in HPI.    Objective:    BP 128/84  Pulse 76  Temp(Src) 97.6 F (36.4 C) (Temporal)  Ht 5' 3.16" (1.604 m)  Wt 191 lb (86.637 kg)  BMI 33.67 kg/m2  SpO2 96% BP 128/84  Pulse 76  Temp(Src) 97.6 F (36.4 C) (Temporal)  Ht 5' 3.16" (1.604 m)  Wt 191 lb (86.637 kg)  BMI 33.67 kg/m2  SpO2 96% General appearance: alert, cooperative, appears older than stated age and no distress Head: Normocephalic, without obvious abnormality, atraumatic Eyes: negative findings: lids and lashes normal and conjunctivae and sclerae normal Lungs: clear to auscultation bilaterally Breasts: No nipple retraction or dimpling, No nipple discharge or bleeding, No axillary or supraclavicular adenopathy, positive findings: 1 cm, irregular, rubbery, non-tender and . nodule located on the right upper outer quadrant and at 12 o'clock Heart: regular rate and rhythm, S1, S2 normal, no murmur, click, rub or gallop Abdomen: soft, non-tender; bowel sounds normal; no masses,  no organomegaly Pelvic: adnexae not palpable, cervix normal in appearance, external genitalia normal, no adnexal masses or tenderness, no bladder tenderness, no cervical  motion tenderness, perianal skin: no external genital warts noted, positive findings: cystocele, uterus normal size, shape, and consistency and vagina normal without discharge Extremities: extremities normal, atraumatic, no cyanosis or edema Pulses: 2+ and symmetric Skin: Skin color, texture, turgor normal. No rashes or lesions Lymph nodes: Cervical, supraclavicular, and axillary nodes normal.    Assessment:     1. Essential hypertension, benign   2. Situational mixed anxiety and depressive disorder   3. Routine gynecological examination   4. Situational depression   5. Elevated blood pressure   6. Cystocele         Plan:   See problem list for complete A&P F/u 2 wks.

## 2013-10-27 NOTE — Assessment & Plan Note (Addendum)
Started lisinopril. Good control, but thinks it has caused cough. Her mother is intolerant to ACEI (cough). Will start propranolol 40 mg qhs. To help w/anxiety also. F/u 2 wks.

## 2013-10-27 NOTE — Assessment & Plan Note (Signed)
Goal at last visit was to use exercise to help manage stress.  Not exercising regularly. Mother has moved in permanently. Does not know if she wants to use meds. Changing BP med due to cough-will try propranolol as it may help w/anxiety also. F/u 2 wks.

## 2013-10-27 NOTE — Telephone Encounter (Signed)
pls call pt: Advise pap is nml. Next pap in 5 years.

## 2013-10-27 NOTE — Assessment & Plan Note (Signed)
Pap w/HPV. Urethral prolapse. Will monitor results. Kegels, bladder training.

## 2013-10-28 NOTE — Telephone Encounter (Signed)
LMOVM for pt to return call 

## 2013-10-28 NOTE — Telephone Encounter (Signed)
Patient returned call and was given results.  

## 2013-11-04 ENCOUNTER — Other Ambulatory Visit: Payer: Self-pay | Admitting: *Deleted

## 2013-11-04 DIAGNOSIS — I10 Essential (primary) hypertension: Secondary | ICD-10-CM

## 2013-11-04 MED ORDER — PROPRANOLOL HCL 20 MG PO TABS: 40.0000 mg | ORAL_TABLET | Freq: Every day | ORAL | Status: DC

## 2013-11-10 ENCOUNTER — Encounter: Payer: Self-pay | Admitting: Nurse Practitioner

## 2013-11-11 ENCOUNTER — Ambulatory Visit: Payer: PRIVATE HEALTH INSURANCE | Admitting: Nurse Practitioner

## 2013-11-18 ENCOUNTER — Other Ambulatory Visit: Payer: Self-pay

## 2013-11-18 DIAGNOSIS — I10 Essential (primary) hypertension: Secondary | ICD-10-CM

## 2013-11-18 MED ORDER — PROPRANOLOL HCL 20 MG PO TABS: 40.0000 mg | ORAL_TABLET | Freq: Every day | ORAL | Status: DC

## 2014-01-05 ENCOUNTER — Other Ambulatory Visit: Payer: Self-pay

## 2014-01-05 DIAGNOSIS — I10 Essential (primary) hypertension: Secondary | ICD-10-CM

## 2014-01-05 MED ORDER — PROPRANOLOL HCL 20 MG PO TABS: 40.0000 mg | ORAL_TABLET | Freq: Every day | ORAL | Status: DC

## 2014-02-20 ENCOUNTER — Other Ambulatory Visit: Payer: Self-pay

## 2014-02-20 DIAGNOSIS — I10 Essential (primary) hypertension: Secondary | ICD-10-CM

## 2014-02-20 MED ORDER — PROPRANOLOL HCL 20 MG PO TABS: 40.0000 mg | ORAL_TABLET | Freq: Every day | ORAL | Status: DC

## 2014-03-11 ENCOUNTER — Other Ambulatory Visit: Payer: Self-pay

## 2014-03-11 DIAGNOSIS — I10 Essential (primary) hypertension: Secondary | ICD-10-CM

## 2014-03-11 MED ORDER — PROPRANOLOL HCL 20 MG PO TABS: 40.0000 mg | ORAL_TABLET | Freq: Every day | ORAL | Status: DC

## 2014-04-07 ENCOUNTER — Other Ambulatory Visit: Payer: Self-pay | Admitting: *Deleted

## 2014-04-07 DIAGNOSIS — I10 Essential (primary) hypertension: Secondary | ICD-10-CM

## 2014-04-07 MED ORDER — PROPRANOLOL HCL 20 MG PO TABS: 40.0000 mg | ORAL_TABLET | Freq: Every day | ORAL | Status: DC

## 2014-04-20 ENCOUNTER — Ambulatory Visit (HOSPITAL_BASED_OUTPATIENT_CLINIC_OR_DEPARTMENT_OTHER)
Admission: RE | Admit: 2014-04-20 | Discharge: 2014-04-20 | Disposition: A | Discharge status: Home / Self Care | Payer: Self-pay | Source: Ambulatory Visit | Attending: Nurse Practitioner | Admitting: Nurse Practitioner

## 2014-04-20 ENCOUNTER — Encounter: Payer: Self-pay | Admitting: Nurse Practitioner

## 2014-04-20 ENCOUNTER — Ambulatory Visit (INDEPENDENT_AMBULATORY_CARE_PROVIDER_SITE_OTHER): Payer: Self-pay | Admitting: Nurse Practitioner

## 2014-04-20 VITALS — BP 121/87 | HR 81 | Temp 97.8°F | Ht 63.16 in | Wt 198.0 lb

## 2014-04-20 DIAGNOSIS — R1013 Epigastric pain: Secondary | ICD-10-CM

## 2014-04-20 DIAGNOSIS — F32A Depression, unspecified: Secondary | ICD-10-CM

## 2014-04-20 DIAGNOSIS — F329 Major depressive disorder, single episode, unspecified: Secondary | ICD-10-CM

## 2014-04-20 DIAGNOSIS — IMO0001 Reserved for inherently not codable concepts without codable children: Secondary | ICD-10-CM

## 2014-04-20 DIAGNOSIS — Z6835 Body mass index (BMI) 35.0-35.9, adult: Secondary | ICD-10-CM

## 2014-04-20 DIAGNOSIS — R030 Elevated blood-pressure reading, without diagnosis of hypertension: Secondary | ICD-10-CM

## 2014-04-20 MED ORDER — FLUOXETINE HCL (PMDD) 20 MG PO CAPS: 20.0000 mg | ORAL_CAPSULE | Freq: Every day | ORAL | Status: DC

## 2014-04-20 MED ORDER — HYDROCODONE-ACETAMINOPHEN 5-325 MG PO TABS
1.0000 | ORAL_TABLET | Freq: Four times a day (QID) | ORAL | Status: DC | PRN
Start: 2014-04-20 — End: 2014-09-10

## 2014-04-20 NOTE — Progress Notes (Signed)
Pre visit review using our clinic review tool, if applicable. No additional management support is needed unless otherwise documented below in the visit note. 

## 2014-04-20 NOTE — Patient Instructions (Signed)
Get ultrasound. We will call with results and follow up. Appointment is at 5:30. No food or drink until after exam.  Take hydrocodone if pain severe again.  Start fluoxetine. Take at night. See me in 6 weeks to evaluate.  See handout for meal plan for best nutrition & weight loss.

## 2014-04-20 NOTE — Assessment & Plan Note (Signed)
Well controlled. RF: obesity, sedentary Encouraged wt loss-gave specific meal plan w/grocery list.

## 2014-04-20 NOTE — Assessment & Plan Note (Signed)
Leaned against sink-felt sudden onset epigastric pain & nausea. Lasted 12h Flatulence & belching Taking TUMS 3-4 times/week for heart burn for several mos. DD: PUD, gastritis, reflux, GB disease US abdomen hydrocodone

## 2014-04-20 NOTE — Assessment & Plan Note (Signed)
11 lb gain in 16 mos. Not exercising Gave nutrient dense meal plan. Start treatment for depression. F/u 6 wks.

## 2014-04-20 NOTE — Assessment & Plan Note (Signed)
Tearful. Gained 11 lbs in 1 yr-states "I know I need to lose weight, but I don't care". Husband career in transition-may be moving. Does not feel hopeless, but frustrated & becoming complacent. Wants to start medication. Fluoxetine 20 mg qhs. F/u 6 wks.

## 2014-04-20 NOTE — Progress Notes (Signed)
Subjective:     Caitlin Walters is a 58 y.o. female who presents for evaluation of abdominal pain. Onset was 24 hours ago when she leaned over sink, pressing epigastric area after drinking a chocolate & peanut butter shake. Pain lasted about 12 hrs & gradually subsided without intervention. The pain is described as aching, and is 7/10 in intensity. Pain is located in the RUQ and epigastric region .  Aggravating factors: none.  Alleviating factors: none. Associated symptoms: anorexia, belching, flatus and nausea. The patient denies constipation, diarrhea, dysuria and fever. She has been taking TUMS 3 to 4 nights/week for heartburn w/relief For about 2 mos. She does not use NSAIDS, drink ETOH or smoke. SHe has had 11 lb wt gain in 16 mos. We have talked in previous visits about depression. She has not wanted to start medication, but used friends & pastors as support system. Today, she says she is ready to start medication. She is frustrated w/weight gain over last year, yet is not motivated to change lifestyle. SHe is tearful as she expresses her frustration.    The patient's history has been marked as reviewed and updated as appropriate.  Review of Systems Pertinent items are noted in HPI.     Objective:    BP 121/87 mmHg  Pulse 81  Temp(Src) 97.8 F (36.6 C) (Temporal)  Ht 5' 3.16" (1.604 m)  Wt 198 lb (89.812 kg)  BMI 34.91 kg/m2  SpO2 98% General appearance: alert, cooperative, appears stated age and no distress Head: Normocephalic, without obvious abnormality, atraumatic Eyes: negative findings: lids and lashes normal and conjunctivae and sclerae normal Abdomen: soft, non-tender; bowel sounds normal; no masses,  no organomegaly    Assessment:     1. Depression - Fluoxetine HCl, PMDD, 20 MG CAPS; Take 1 capsule (20 mg total) by mouth at bedtime.  Dispense: 30 each; Refill: 1  2. Epigastric pain - US Abdomen Complete; Future - HYDROcodone-acetaminophen (NORCO/VICODIN) 5-325 MG per  tablet; Take 1 tablet by mouth every 6 (six) hours as needed for moderate pain.  Dispense: 30 tablet; Refill: 0  3. Elevated blood pressure  4. BMI 35.0-35.9,adult  See problem list for complete A&P See pt instructions. F/u  As appropriate per US/6 weeks for depression

## 2014-04-21 ENCOUNTER — Telehealth: Payer: Self-pay | Admitting: Nurse Practitioner

## 2014-04-21 DIAGNOSIS — K219 Gastro-esophageal reflux disease without esophagitis: Secondary | ICD-10-CM

## 2014-04-21 MED ORDER — OMEPRAZOLE 40 MG PO CPDR: 40.0000 mg | DELAYED_RELEASE_CAPSULE | Freq: Every day | ORAL | Status: DC

## 2014-04-21 NOTE — Telephone Encounter (Signed)
GB US shows cholesterolosis in GB & fatty liver, but no acute disease. Will treat sudden episode of epigastric pain , flatulence & belching as severe reflux or PUD Start PPI. If no improvement, will ref to GI. F/u 4 weeks. Must make diet changes.

## 2014-04-21 NOTE — Telephone Encounter (Signed)
Patient aware of results. Patient informed by Yolonda KidaKayne.

## 2014-04-22 NOTE — Telephone Encounter (Signed)
LMOVM for pt to cb to schedule 4 week f/u appt.

## 2014-06-30 ENCOUNTER — Other Ambulatory Visit: Payer: Self-pay | Admitting: *Deleted

## 2014-06-30 DIAGNOSIS — I10 Essential (primary) hypertension: Secondary | ICD-10-CM

## 2014-06-30 DIAGNOSIS — F329 Major depressive disorder, single episode, unspecified: Secondary | ICD-10-CM

## 2014-06-30 DIAGNOSIS — F32A Depression, unspecified: Secondary | ICD-10-CM

## 2014-06-30 MED ORDER — PROPRANOLOL HCL 20 MG PO TABS: 40.0000 mg | ORAL_TABLET | Freq: Every day | ORAL | Status: DC

## 2014-06-30 MED ORDER — FLUOXETINE HCL (PMDD) 20 MG PO CAPS: 20.0000 mg | ORAL_CAPSULE | Freq: Every day | ORAL | Status: DC

## 2014-06-30 NOTE — Telephone Encounter (Signed)
Refill request for fluoxetine Last filled by MD on- 04/20/14 #30 x1 Last Appt: 04/20/2014 Next Appt: none Please advise refill?

## 2014-09-04 ENCOUNTER — Other Ambulatory Visit: Payer: Self-pay

## 2014-09-04 DIAGNOSIS — I10 Essential (primary) hypertension: Secondary | ICD-10-CM

## 2014-09-04 NOTE — Telephone Encounter (Signed)
Called patient and made an OV f/u for 09/10/14 at 8am. Per patient she has enough to get her through the appointment.

## 2014-09-04 NOTE — Telephone Encounter (Signed)
Please Advise Refill Request? Refill request for- Propanolol 20 mg tab Last filled by MD on - 06/30/14 Last Appt - 04/20/14        [facility]

## 2014-09-04 NOTE — Telephone Encounter (Signed)
pls call pt: Advise She needs to schedule an appt. I will call in 30 tabs bp med if she will run out before appt.

## 2014-09-10 ENCOUNTER — Other Ambulatory Visit: Payer: Self-pay | Admitting: Nurse Practitioner

## 2014-09-10 ENCOUNTER — Ambulatory Visit (INDEPENDENT_AMBULATORY_CARE_PROVIDER_SITE_OTHER): Payer: Self-pay | Admitting: Nurse Practitioner

## 2014-09-10 ENCOUNTER — Encounter: Payer: Self-pay | Admitting: Nurse Practitioner

## 2014-09-10 VITALS — BP 127/85 | HR 51 | Temp 97.8°F | Ht 63.0 in | Wt 173.0 lb

## 2014-09-10 DIAGNOSIS — F32A Depression, unspecified: Secondary | ICD-10-CM

## 2014-09-10 DIAGNOSIS — K219 Gastro-esophageal reflux disease without esophagitis: Secondary | ICD-10-CM

## 2014-09-10 DIAGNOSIS — I10 Essential (primary) hypertension: Secondary | ICD-10-CM

## 2014-09-10 DIAGNOSIS — F329 Major depressive disorder, single episode, unspecified: Secondary | ICD-10-CM

## 2014-09-10 DIAGNOSIS — Z6830 Body mass index (BMI) 30.0-30.9, adult: Secondary | ICD-10-CM | POA: Insufficient documentation

## 2014-09-10 DIAGNOSIS — R1013 Epigastric pain: Secondary | ICD-10-CM

## 2014-09-10 LAB — COMPREHENSIVE METABOLIC PANEL
ALBUMIN: 4.3 g/dL (ref 3.5–5.2)
ALK PHOS: 83 U/L (ref 39–117)
ALT: 19 U/L (ref 0–35)
AST: 20 U/L (ref 0–37)
BILIRUBIN TOTAL: 0.6 mg/dL (ref 0.2–1.2)
BUN: 19 mg/dL (ref 6–23)
CHLORIDE: 103 meq/L (ref 96–112)
CO2: 33 mEq/L — ABNORMAL HIGH (ref 19–32)
Calcium: 9.5 mg/dL (ref 8.4–10.5)
Creatinine, Ser: 0.7 mg/dL (ref 0.40–1.20)
GFR: 91.42 mL/min (ref >60.00)
GLUCOSE: 95 mg/dL (ref 70–99)
Potassium: 4.9 mEq/L (ref 3.5–5.1)
Sodium: 140 mEq/L (ref 135–145)
TOTAL PROTEIN: 7.1 g/dL (ref 6.0–8.3)

## 2014-09-10 LAB — LIPID PANEL
Cholesterol: 232 mg/dL — ABNORMAL HIGH (ref 0–200)
HDL: 46.9 mg/dL (ref >39.00)
LDL Cholesterol: 165 mg/dL — ABNORMAL HIGH (ref 0–99)
NONHDL: 185.1
Total CHOL/HDL Ratio: 5
Triglycerides: 102 mg/dL (ref 0.0–149.0)
VLDL: 20.4 mg/dL (ref 0.0–40.0)

## 2014-09-10 MED ORDER — OMEPRAZOLE 20 MG PO CPDR: 20.0000 mg | DELAYED_RELEASE_CAPSULE | Freq: Every day | ORAL | Status: DC

## 2014-09-10 NOTE — Assessment & Plan Note (Signed)
Pt wants to wean off med. Feels wt loss has improved mood. Instructed on wean.

## 2014-09-10 NOTE — Telephone Encounter (Signed)
Called and spoke with patient about meds. Will await a call back for where to send meds.

## 2014-09-10 NOTE — Progress Notes (Signed)
Pre visit review using our clinic review tool, if applicable. No additional management support is needed unless otherwise documented below in the visit note. 

## 2014-09-10 NOTE — Patient Instructions (Signed)
My office will call with lab results.  Decrease omeprazole to 2 to 4 times weekly. If no symptoms, take as needed.  Wean off fluoxetine as follows: skip dose every 3rd day for 2 cycles, then take every other day for 2 cycles, then take every 3rd day for cycles, then stop.  Continue exercise & diet changes! GREAT job! I am so proud of you!  Consider increasing exercise to 5 days week for best health.  Happy Caitlin Walters!

## 2014-09-10 NOTE — Progress Notes (Signed)
Subjective:     Caitlin Walters is a 59 y.o. female presents to f/u HTN, depression, epigastric pain. She has lost 25 lbs by increasing activity & diet changes. She feels great! HTN: controlled on propranolol 40 mg qd. No intol SE. Pulse 51 today. Exercising 3d/wk, made diet changes-less meat & sugar, more vegetables. Depression:Feels well. Wants to wean off fluox. Feels recent wt loss has lifted spirits. Discussed 2 week wean & potential w/drawal SE. epigastric pain:No symptoms since started omep & lost weight w/diet & exercise changes. She has decresaed dose to 20 mg for last 6-8 weeks. Abd US revealed fatty liver & cholest deposition in GB.  Mother & father w/signf heart disease. Discussed may benefit from statin. Pt agrees to get chol panel.  The following portions of the patient's history were reviewed and updated as appropriate: allergies, current medications, past family history, past medical history, past social history, past surgical history and problem list.  Review of Systems Constitutional: negative for fatigue and night sweats Respiratory: negative for cough Cardiovascular: negative for chest pressure/discomfort, irregular heart beat and lower extremity edema Gastrointestinal: negative for abdominal pain, constipation, diarrhea, nausea and reflux symptoms Neurological: negative for headaches Behavioral/Psych: negative for anxiety, depression and sleep disturbance    Objective:    BP 127/85 mmHg  Pulse 51  Temp(Src) 97.8 F (36.6 C) (Temporal)  Ht 5\' 3"  (1.6 m)  Wt 173 lb (78.472 kg)  BMI 30.65 kg/m2  SpO2 97% BP 127/85 mmHg  Pulse 51  Temp(Src) 97.8 F (36.6 C) (Temporal)  Ht 5\' 3"  (1.6 m)  Wt 173 lb (78.472 kg)  BMI 30.65 kg/m2  SpO2 97% General appearance: alert, cooperative, appears stated age and no distress Head: Normocephalic, without obvious abnormality, atraumatic Eyes: negative findings: lids and lashes normal, conjunctivae and sclerae normal and wearing  glasses Neck: no adenopathy, no carotid bruit, supple, symmetrical, trachea midline and thyroid not enlarged, symmetric, no tenderness/mass/nodules Lungs: clear to auscultation bilaterally Heart: regular rate and rhythm, S1, S2 normal, no murmur, click, rub or gallop Abdomen: soft, non-tender; bowel sounds normal; no masses,  no organomegaly Extremities: extremities normal, atraumatic, no cyanosis or edema Lymph nodes: Cervical, supraclavicular, and axillary nodes normal. Neurologic: Grossly normal    Assessment:Plan    1. Essential hypertension, benign - Comprehensive metabolic panel - Lipid panel - Microalbumin / creatinine urine ratio  2. BMI 30.0-30.9,adult Continue exercise & diet changes  3. Epigastric pain No symptoms Decrease dose freq - omeprazole (PRILOSEC) 20 MG capsule; Take 1 capsule (20 mg total) by mouth daily.  Dispense: 30 capsule; Refill: 3  4. Depression Instructions to wean off fluox  F/u 6 mos See pt instructions

## 2014-09-10 NOTE — Telephone Encounter (Signed)
pls adv pt to divide propranolol dose: take 20 mg in am & 20 mg at bedtime. Her pulse was slightly low today. Also, give her number for outpatient pharm in [place]. She may want to see if meds are [facility]. I pended script. Tell her to call us if she wants to change pharms.

## 2014-09-10 NOTE — Assessment & Plan Note (Signed)
Decrease omeprazole to 2 to 4 times week

## 2014-09-14 ENCOUNTER — Other Ambulatory Visit: Payer: Self-pay | Admitting: Nurse Practitioner

## 2014-09-14 DIAGNOSIS — E785 Hyperlipidemia, unspecified: Secondary | ICD-10-CM

## 2014-09-14 MED ORDER — ROSUVASTATIN CALCIUM 20 MG PO TABS: ORAL_TABLET | ORAL | Status: DC

## 2014-09-14 MED ORDER — PROPRANOLOL HCL 20 MG PO TABS: 20.0000 mg | ORAL_TABLET | Freq: Two times a day (BID) | ORAL | Status: DC

## 2014-09-14 NOTE — Telephone Encounter (Signed)
[facility] called back and [facility]

## 2014-09-14 NOTE — Telephone Encounter (Signed)
Called and spoke with patient. [facility] [place] to see which place is cheaper. 

## 2014-09-14 NOTE — Telephone Encounter (Signed)
pls call pt: Advise I do want to start statin, due to family Hx & bad cholesterol is too high. I sent script. She will take 4 days/week. Return for fasting lab appt. In 8 weeks.  Goal for bad cholesterol is under 100. It is 165 now. She should let me know if there is cost issue or SE such as muscle aches. PLS change pharmacy if she wants to go to [facility] [place].

## 2014-09-14 NOTE — Telephone Encounter (Signed)
Called and spoke with patient. [facility] [place] [facility] see which place is cheaper.

## 2014-09-14 NOTE — Telephone Encounter (Signed)
Patient called back and stated that the [facility] was [facility] ([facility]. Patient doesn't want to drive to [place] as its too far of a drive, is there anything else you could call in for her?

## 2014-09-14 NOTE — Telephone Encounter (Signed)
[facility]

## 2014-09-16 ENCOUNTER — Other Ambulatory Visit: Payer: Self-pay | Admitting: Nurse Practitioner

## 2014-09-16 NOTE — Telephone Encounter (Signed)
[facility]: Lovastatin 10/20 mg  [facility]: Lovastatin 10/20 mg and Pravastatin 04/08/39 mg

## 2014-09-16 NOTE — Telephone Encounter (Signed)
[facility] list for statins. Let me know what drugs are there. I will prescribe tomorrow.

## 2014-09-21 ENCOUNTER — Other Ambulatory Visit: Payer: Self-pay | Admitting: Nurse Practitioner

## 2014-09-21 DIAGNOSIS — E785 Hyperlipidemia, unspecified: Secondary | ICD-10-CM

## 2014-09-21 MED ORDER — LOVASTATIN 20 MG PO TABS: 20.0000 mg | ORAL_TABLET | Freq: Every day | ORAL | Status: DC

## 2014-09-21 NOTE — Progress Notes (Signed)
Called patient. Meds sent to [facility] in [place]. Patient will CB to schedule f/u appt.

## 2014-11-07 ENCOUNTER — Encounter (HOSPITAL_COMMUNITY): Payer: Self-pay | Admitting: *Deleted

## 2014-11-07 ENCOUNTER — Emergency Department (HOSPITAL_COMMUNITY): Payer: No Typology Code available for payment source

## 2014-11-07 ENCOUNTER — Emergency Department (HOSPITAL_COMMUNITY)
Admission: EM | Admit: 2014-11-07 | Discharge: 2014-11-07 | Disposition: A | Discharge status: Home / Self Care | Payer: No Typology Code available for payment source | Attending: Emergency Medicine | Admitting: Emergency Medicine

## 2014-11-07 DIAGNOSIS — Z7982 Long term (current) use of aspirin: Secondary | ICD-10-CM | POA: Insufficient documentation

## 2014-11-07 DIAGNOSIS — Z79899 Other long term (current) drug therapy: Secondary | ICD-10-CM | POA: Diagnosis not present

## 2014-11-07 DIAGNOSIS — S299XXA Unspecified injury of thorax, initial encounter: Secondary | ICD-10-CM | POA: Insufficient documentation

## 2014-11-07 DIAGNOSIS — Z8719 Personal history of other diseases of the digestive system: Secondary | ICD-10-CM | POA: Insufficient documentation

## 2014-11-07 DIAGNOSIS — Y998 Other external cause status: Secondary | ICD-10-CM | POA: Diagnosis not present

## 2014-11-07 DIAGNOSIS — Y9389 Activity, other specified: Secondary | ICD-10-CM | POA: Insufficient documentation

## 2014-11-07 DIAGNOSIS — Y92410 Unspecified street and highway as the place of occurrence of the external cause: Secondary | ICD-10-CM | POA: Diagnosis not present

## 2014-11-07 DIAGNOSIS — I10 Essential (primary) hypertension: Secondary | ICD-10-CM | POA: Insufficient documentation

## 2014-11-07 DIAGNOSIS — Z880 Allergy status to penicillin: Secondary | ICD-10-CM | POA: Insufficient documentation

## 2014-11-07 DIAGNOSIS — Z8619 Personal history of other infectious and parasitic diseases: Secondary | ICD-10-CM | POA: Insufficient documentation

## 2014-11-07 DIAGNOSIS — S8991XA Unspecified injury of right lower leg, initial encounter: Secondary | ICD-10-CM | POA: Insufficient documentation

## 2014-11-07 MED ORDER — ACETAMINOPHEN 500 MG PO TABS
1000.0000 mg | ORAL_TABLET | Freq: Once | ORAL | Status: AC
  Administered 2014-11-07: 1000 mg via ORAL
  Filled 2014-11-07: qty 2

## 2014-11-07 MED ORDER — HYDROCODONE-ACETAMINOPHEN 5-325 MG PO TABS: 1.0000 | ORAL_TABLET | Freq: Four times a day (QID) | ORAL | Status: DC | PRN

## 2014-11-07 MED ORDER — HYDROCODONE-ACETAMINOPHEN 5-325 MG PO TABS
1.0000 | ORAL_TABLET | Freq: Once | ORAL | Status: DC
  Filled 2014-11-07: qty 1

## 2014-11-07 NOTE — ED Notes (Signed)
Dr. Rees at the bedside.  

## 2014-11-07 NOTE — ED Notes (Signed)
Dr. Post at the bedside.
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
## 2014-11-07 NOTE — ED Notes (Signed)
Dr. Arlie SolomonsPost at the the bedside.

## 2014-11-07 NOTE — Discharge Instructions (Signed)
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
## 2014-11-11 ENCOUNTER — Encounter: Payer: Self-pay | Admitting: Nurse Practitioner

## 2014-11-11 ENCOUNTER — Other Ambulatory Visit: Payer: Self-pay

## 2014-11-11 ENCOUNTER — Other Ambulatory Visit: Payer: Self-pay | Admitting: Nurse Practitioner
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
## 2014-12-10 ENCOUNTER — Telehealth: Payer: Self-pay | Admitting: Nurse Practitioner

## 2014-12-10 NOTE — Telephone Encounter (Signed)
Pt. Is having vertigo symptoms and would like to know what she can do to relieve the pain. She has scheduled an appt for tomorrow at 9:30am. Please call she is concerned about the ameba parasite.

## 2014-12-10 NOTE — Telephone Encounter (Signed)
Please advise 

## 2014-12-10 NOTE — Telephone Encounter (Signed)
Please disregard original request. Pt does not need a call back

## 2014-12-11 ENCOUNTER — Ambulatory Visit: Payer: Self-pay | Admitting: Nurse Practitioner

## 2014-12-11 ENCOUNTER — Other Ambulatory Visit: Payer: Self-pay

## 2014-12-17 ENCOUNTER — Other Ambulatory Visit: Payer: Self-pay

## 2014-12-22 ENCOUNTER — Other Ambulatory Visit: Payer: Self-pay

## 2014-12-22 ENCOUNTER — Other Ambulatory Visit: Payer: Self-pay | Admitting: Family Medicine

## 2014-12-22 DIAGNOSIS — E785 Hyperlipidemia, unspecified: Secondary | ICD-10-CM

## 2014-12-22 MED ORDER — LOVASTATIN 20 MG PO TABS: 20.0000 mg | ORAL_TABLET | Freq: Every day | ORAL | Status: DC

## 2014-12-22 NOTE — Telephone Encounter (Signed)
Pt was supposed to have lab appointment today but was unable to make appt.  She said the earliest she can come into office for labs is 01/04/15.   Can she have 30 more days of medication to get her through till then.  Please advise.

## 2014-12-28 ENCOUNTER — Telehealth: Payer: Self-pay | Admitting: Nurse Practitioner

## 2014-12-28 NOTE — Telephone Encounter (Signed)
[facility] to [facility] during her mother's appointment the end of June about vertigo she was experiencing. Layne suggested the Epley maneuver. No CB requested.

## 2014-12-28 NOTE — Telephone Encounter (Signed)
Is this FYI or is there a question embedded in your statement?

## 2014-12-28 NOTE — Telephone Encounter (Signed)
It is an FYI.

## 2015-01-01 ENCOUNTER — Other Ambulatory Visit: Payer: Self-pay

## 2015-01-01 DIAGNOSIS — Z1231 Encounter for screening mammogram for malignant neoplasm of breast: Secondary | ICD-10-CM

## 2015-01-04 ENCOUNTER — Other Ambulatory Visit: Payer: Self-pay

## 2015-01-22 ENCOUNTER — Other Ambulatory Visit: Payer: Self-pay

## 2015-01-28 ENCOUNTER — Other Ambulatory Visit: Payer: Self-pay | Admitting: Family Medicine

## 2015-01-28 DIAGNOSIS — E785 Hyperlipidemia, unspecified: Secondary | ICD-10-CM

## 2015-01-28 MED ORDER — LOVASTATIN 20 MG PO TABS: 20.0000 mg | ORAL_TABLET | Freq: Every day | ORAL | Status: DC

## 2015-01-28 NOTE — Telephone Encounter (Signed)
Pt called requesting rf of lovastatin.  Pt was supposed to have lab visit tomorrow am but has been sent out of town.  She rescheduled 02/09/15.  Sent in 30 day supply for pt.

## 2015-01-29 ENCOUNTER — Other Ambulatory Visit: Payer: Self-pay

## 2015-02-03 ENCOUNTER — Ambulatory Visit
Admission: RE | Admit: 2015-02-03 | Discharge: 2015-02-03 | Disposition: A | Discharge status: Home / Self Care | Payer: No Typology Code available for payment source | Source: Ambulatory Visit

## 2015-02-03 DIAGNOSIS — Z1231 Encounter for screening mammogram for malignant neoplasm of breast: Secondary | ICD-10-CM

## 2015-02-03 DIAGNOSIS — N6320 Unspecified lump in the left breast, unspecified quadrant: Secondary | ICD-10-CM

## 2015-02-04 DIAGNOSIS — N6320 Unspecified lump in the left breast, unspecified quadrant: Secondary | ICD-10-CM | POA: Insufficient documentation

## 2015-02-05 ENCOUNTER — Other Ambulatory Visit: Payer: Self-pay | Admitting: Family Medicine

## 2015-02-05 DIAGNOSIS — R928 Other abnormal and inconclusive findings on diagnostic imaging of breast: Secondary | ICD-10-CM

## 2015-02-08 ENCOUNTER — Ambulatory Visit: Payer: Self-pay

## 2015-02-09 ENCOUNTER — Other Ambulatory Visit: Payer: Self-pay

## 2015-02-09 ENCOUNTER — Ambulatory Visit
Admission: RE | Admit: 2015-02-09 | Discharge: 2015-02-09 | Disposition: A | Discharge status: Home / Self Care | Payer: No Typology Code available for payment source | Source: Ambulatory Visit | Attending: Family Medicine | Admitting: Family Medicine

## 2015-02-09 DIAGNOSIS — N6320 Unspecified lump in the left breast, unspecified quadrant: Secondary | ICD-10-CM

## 2015-02-09 DIAGNOSIS — R928 Other abnormal and inconclusive findings on diagnostic imaging of breast: Secondary | ICD-10-CM

## 2015-03-03 ENCOUNTER — Other Ambulatory Visit: Payer: Self-pay

## 2015-03-04 ENCOUNTER — Other Ambulatory Visit: Payer: Self-pay | Admitting: *Deleted

## 2015-03-04 ENCOUNTER — Other Ambulatory Visit (INDEPENDENT_AMBULATORY_CARE_PROVIDER_SITE_OTHER): Payer: Self-pay

## 2015-03-04 DIAGNOSIS — E785 Hyperlipidemia, unspecified: Secondary | ICD-10-CM

## 2015-03-04 LAB — COMPREHENSIVE METABOLIC PANEL
ALBUMIN: 4.5 g/dL (ref 3.5–5.2)
ALT: 17 U/L (ref 0–35)
AST: 20 U/L (ref 0–37)
Alkaline Phosphatase: 79 U/L (ref 39–117)
BUN: 18 mg/dL (ref 6–23)
CO2: 30 mEq/L (ref 19–32)
CREATININE: 0.68 mg/dL (ref 0.40–1.20)
Calcium: 9.6 mg/dL (ref 8.4–10.5)
Chloride: 104 mEq/L (ref 96–112)
GFR: 94.37 mL/min (ref >60.00)
Glucose, Bld: 91 mg/dL (ref 70–99)
Potassium: 4.2 mEq/L (ref 3.5–5.1)
Sodium: 141 mEq/L (ref 135–145)
TOTAL PROTEIN: 7.2 g/dL (ref 6.0–8.3)
Total Bilirubin: 0.7 mg/dL (ref 0.2–1.2)

## 2015-03-04 LAB — LIPID PANEL
Cholesterol: 195 mg/dL (ref 0–200)
HDL: 57.4 mg/dL (ref >39.00)
LDL Cholesterol: 117 mg/dL — ABNORMAL HIGH (ref 0–99)
NonHDL: 137.3
Total CHOL/HDL Ratio: 3
Triglycerides: 103 mg/dL (ref 0.0–149.0)
VLDL: 20.6 mg/dL (ref 0.0–40.0)

## 2015-03-04 MED ORDER — LOVASTATIN 20 MG PO TABS: 20.0000 mg | ORAL_TABLET | Freq: Every day | ORAL | Status: DC

## 2015-03-05 ENCOUNTER — Telehealth: Payer: Self-pay | Admitting: Family Medicine

## 2015-03-05 NOTE — Telephone Encounter (Signed)
Please call patient, her lab work looks good. I will cover this with her in more detail at her office appointment coming up. Thanks

## 2015-03-05 NOTE — Telephone Encounter (Signed)
Left detailed message on patient's cell.  Okay per DPR.   

## 2015-03-10 ENCOUNTER — Encounter: Payer: Self-pay | Admitting: Family Medicine

## 2015-03-15 ENCOUNTER — Ambulatory Visit: Payer: Self-pay | Admitting: Family Medicine

## 2015-04-01 ENCOUNTER — Ambulatory Visit (INDEPENDENT_AMBULATORY_CARE_PROVIDER_SITE_OTHER): Payer: Self-pay | Admitting: Family Medicine

## 2015-04-01 ENCOUNTER — Encounter: Payer: Self-pay | Admitting: Family Medicine

## 2015-04-01 VITALS — BP 114/77 | HR 64 | Temp 99.1°F | Resp 18 | Ht 63.0 in | Wt 183.0 lb

## 2015-04-01 DIAGNOSIS — IMO0001 Reserved for inherently not codable concepts without codable children: Secondary | ICD-10-CM

## 2015-04-01 DIAGNOSIS — R030 Elevated blood-pressure reading, without diagnosis of hypertension: Secondary | ICD-10-CM

## 2015-04-01 DIAGNOSIS — E785 Hyperlipidemia, unspecified: Secondary | ICD-10-CM

## 2015-04-01 DIAGNOSIS — I10 Essential (primary) hypertension: Secondary | ICD-10-CM

## 2015-04-01 DIAGNOSIS — Z6830 Body mass index (BMI) 30.0-30.9, adult: Secondary | ICD-10-CM

## 2015-04-01 MED ORDER — PROPRANOLOL HCL 20 MG PO TABS: 20.0000 mg | ORAL_TABLET | Freq: Two times a day (BID) | ORAL | Status: DC

## 2015-04-01 MED ORDER — LOVASTATIN 20 MG PO TABS: 20.0000 mg | ORAL_TABLET | Freq: Every day | ORAL | Status: AC

## 2015-04-01 NOTE — Progress Notes (Signed)
Subjective:    Patient ID: Caitlin Walters, female    DOB: [birth year], 59 y.o.   MRN: [id number]  HPI  Hyperlipidemia: Patient presents for scheduled office visit for follow-up on her hyperlipidemia. Patient was recently started with the last 2-3 months on a statin medication after having elevated cholesterol levels. In March she was found to have a cholesterol 232, triglycerides 102, HDL 46, LDL 165. Follow-up labs after starting statin was collected 03/04/2015 patient cholesterol level at that time was 195, triglycerides 103, HDL 57, LDL 117. Patient denies any negative side effects after starting lovastatin 20 mg. Patient is watching her diet, but is not exercising routinely as of yet.  Hypertension: Hypertension is stable, patient infrequently checks in the outpatient setting. She continues to take her propranolol 20 mg daily. She denies any negative side effects, chest pain, shortness of breath, palpitations or lower extremity edema.   Past Medical History  Diagnosis Date  . Hypertension   . Herpes ocular   . Diverticula of colon 2013   Allergies  Allergen Reactions  . Penicillins Swelling   Past Surgical History  Procedure Laterality Date  . Breast biopsy  1992    right  . Umbilical hernia repair  1994   Social History   Social History  . Marital Status: Married    Spouse Name: daniel  . Number of Children: 2  . 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 Smoking status: Never Smoker   . Smokeless tobacco: Never Used  . Alcohol Use: No  . Drug Use: No  . Sexual Activity: Yes    Birth Control/ Protection: Post-menopausal   Other Topics Concern  . 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 They are from [place], but lived in [place] for long while, moved back to [place] to help son's family with children & wife's illness (brain CA). Son & daughter-in-law divorced 1 year  ago, which has caused tremendous grief in family.    Review of Systems Negative, with the exception of above mentioned in HPI     Objective:   Physical Exam BP 114/77 mmHg  Pulse 64  Temp(Src) 99.1 F (37.3 C) (Oral)  Resp 18  Ht  (1.6 m)  Wt 183 lb (83.008 kg)  BMI 32.43 kg/m2  SpO2 95% Gen: Afebrile. No acute distress. Nontoxic in appearance, well-developed, well-nourished, Caucasian female. Very pleasant., Mildly obese. HENT: AT. [place].MMM. Eyes:Pupils Equal Round Reactive to light, Extraocular movements intact,  Conjunctiva without redness, discharge or icterus. Neck/lymp/endocrine: Supple, no lymphadenopathy, no thyromegaly CV: RRR no murmur appreciated, no edema, +2/4 P posterior tibialis pulses Chest: CTAB, no wheeze or crackles Abd: Soft. Round. NTND. BS present. No Masses palpated.      Assessment & Plan:  1. Essential hypertension, benign - Refills on propranolol prescribed today, [facility]. - Patient is to monitor her blood pressure in the outpatient setting, if her blood pressures are consistently above 140 systolic or 90 diastolic she is to come in sooner to be evaluated. Patient is a self-pay patient, and states that she likely will not be able to come in more than yearly, unless she needs to. - propranolol (INDERAL) 20 MG tablet; Take 1 tablet (20 mg total) by mouth 2 (two) times daily.  Dispense: 180 tablet; Refill: 1  2. Hyperlipidemia -  Reviewed results with patient today, hyperlipidemia is improving on statin medication. Encouraged her to continue to cut back on saturated/trans-fats, he more fresh fruits and vegetables. Patient is to attempt to try to exercise at least 150 minutes a week - lovastatin (MEVACOR) 20 MG tablet; Take 1 tablet (20 mg total) by mouth at bedtime.  Dispense: 90 tablet; Refill: 3  Follow-up 6-12 months, depending upon blood pressures in the outpatient setting.

## 2015-04-01 NOTE — Progress Notes (Signed)
Pre visit review using our clinic review tool, if applicable. No additional management support is needed unless otherwise documented below in the visit note. 

## 2015-04-01 NOTE — Patient Instructions (Signed)
Health Maintenance, Female Adopting a healthy lifestyle and getting preventive care can go a long way to promote health and wellness. Talk with your health care provider about what schedule of regular examinations is right for you. This is a good chance for you to check in with your provider about disease prevention and staying healthy. In between checkups, there are plenty of things you can do on your own. Experts have done a lot of research about which lifestyle changes and preventive measures are most likely to keep you healthy. Ask your health care provider for more information. WEIGHT AND DIET  Eat a healthy diet  Be sure to include plenty of vegetables, fruits, low-fat dairy products, and lean protein.  Do not eat a lot of foods high in solid fats, added sugars, or salt.  Get regular exercise. This is one of the most important things you can do for your health.  Most adults should exercise for at least 150 minutes each week. The exercise should increase your heart rate and make you sweat (moderate-intensity exercise).  Most adults should also do strengthening exercises at least twice a week. This is in addition to the moderate-intensity exercise.  Maintain a healthy weight  Body mass index (BMI) is a measurement that can be used to identify possible weight problems. It estimates body fat based on height and weight. Your health care provider can help determine your BMI and help you achieve or maintain a healthy weight.  For females 20 years of age and older:   A BMI below 18.5 is considered underweight.  A BMI of 18.5 to 24.9 is normal.  A BMI of 25 to 29.9 is considered overweight.  A BMI of 30 and above is considered obese.  Watch levels of cholesterol and blood lipids  You should start having your blood tested for lipids and cholesterol at 58 years of age, then have this test every 5 years.  You may need to have your cholesterol levels checked more often if:  Your lipid  or cholesterol levels are high.  You are older than 58 years of age.  You are at high risk for heart disease.  CANCER SCREENING   Lung Cancer  Lung cancer screening is recommended for adults 55-80 years old who are at high risk for lung cancer because of a history of smoking.  A yearly low-dose CT scan of the lungs is recommended for people who:  Currently smoke.  Have quit within the past 15 years.  Have at least a 30-pack-year history of smoking. A pack year is smoking an average of one pack of cigarettes a day for 1 year.  Yearly screening should continue until it has been 15 years since you quit.  Yearly screening should stop if you develop a health problem that would prevent you from having lung cancer treatment.  Breast Cancer  Practice breast self-awareness. This means understanding how your breasts normally appear and feel.  It also means doing regular breast self-exams. Let your health care provider know about any changes, no matter how small.  If you are in your 20s or 30s, you should have a clinical breast exam (CBE) by a health care provider every 1-3 years as part of a regular health exam.  If you are 40 or older, have a CBE every year. Also consider having a breast X-ray (mammogram) every year.  If you have a family history of breast cancer, talk to your health care provider about genetic screening.  If you   are at high risk for breast cancer, talk to your health care provider about having an MRI and a mammogram every year.  Breast cancer gene (BRCA) assessment is recommended for women who have family members with BRCA-related cancers. BRCA-related cancers include:  Breast.  Ovarian.  Tubal.  Peritoneal cancers.  Results of the assessment will determine the need for genetic counseling and BRCA1 and BRCA2 testing. Cervical Cancer Your health care provider may recommend that you be screened regularly for cancer of the pelvic organs (ovaries, uterus, and  vagina). This screening involves a pelvic examination, including checking for microscopic changes to the surface of your cervix (Pap test). You may be encouraged to have this screening done every 3 years, beginning at age 21.  For women ages 30-65, health care providers may recommend pelvic exams and Pap testing every 3 years, or they may recommend the Pap and pelvic exam, combined with testing for human papilloma virus (HPV), every 5 years. Some types of HPV increase your risk of cervical cancer. Testing for HPV may also be done on women of any age with unclear Pap test results.  Other health care providers may not recommend any screening for nonpregnant women who are considered low risk for pelvic cancer and who do not have symptoms. Ask your health care provider if a screening pelvic exam is right for you.  If you have had past treatment for cervical cancer or a condition that could lead to cancer, you need Pap tests and screening for cancer for at least 20 years after your treatment. If Pap tests have been discontinued, your risk factors (such as having a new sexual partner) need to be reassessed to determine if screening should resume. Some women have medical problems that increase the chance of getting cervical cancer. In these cases, your health care provider may recommend more frequent screening and Pap tests. Colorectal Cancer  This type of cancer can be detected and often prevented.  Routine colorectal cancer screening usually begins at 58 years of age and continues through 58 years of age.  Your health care provider may recommend screening at an earlier age if you have risk factors for colon cancer.  Your health care provider may also recommend using home test kits to check for hidden blood in the stool.  A small camera at the end of a tube can be used to examine your colon directly (sigmoidoscopy or colonoscopy). This is done to check for the earliest forms of colorectal  cancer.  Routine screening usually begins at age 50.  Direct examination of the colon should be repeated every 5-10 years through 58 years of age. However, you may need to be screened more often if early forms of precancerous polyps or small growths are found. Skin Cancer  Check your skin from head to toe regularly.  Tell your health care provider about any new moles or changes in moles, especially if there is a change in a mole's shape or color.  Also tell your health care provider if you have a mole that is larger than the size of a pencil eraser.  Always use sunscreen. Apply sunscreen liberally and repeatedly throughout the day.  Protect yourself by wearing long sleeves, pants, a wide-brimmed hat, and sunglasses whenever you are outside. HEART DISEASE, DIABETES, AND HIGH BLOOD PRESSURE   High blood pressure causes heart disease and increases the risk of stroke. High blood pressure is more likely to develop in:  People who have blood pressure in the high end   of the normal range (130-139/85-89 mm Hg).  People who are overweight or obese.  People who are African American.  If you are 38-23 years of age, have your blood pressure checked every 3-5 years. If you are 61 years of age or older, have your blood pressure checked every year. You should have your blood pressure measured twice--once when you are at a hospital or clinic, and once when you are not at a hospital or clinic. Record the average of the two measurements. To check your blood pressure when you are not at a hospital or clinic, you can use:  An automated blood pressure machine at a pharmacy.  A home blood pressure monitor.  If you are between 45 years and 39 years old, ask your health care provider if you should take aspirin to prevent strokes.  Have regular diabetes screenings. This involves taking a blood sample to check your fasting blood sugar level.  If you are at a normal weight and have a low risk for diabetes,  have this test once every three years after 58 years of age.  If you are overweight and have a high risk for diabetes, consider being tested at a younger age or more often. PREVENTING INFECTION  Hepatitis B  If you have a higher risk for hepatitis B, you should be screened for this virus. You are considered at high risk for hepatitis B if:  You were born in a country where hepatitis B is common. Ask your health care provider which countries are considered high risk.  Your parents were born in a high-risk country, and you have not been immunized against hepatitis B (hepatitis B vaccine).  You have HIV or AIDS.  You use needles to inject street drugs.  You live with someone who has hepatitis B.  You have had sex with someone who has hepatitis B.  You get hemodialysis treatment.  You take certain medicines for conditions, including cancer, organ transplantation, and autoimmune conditions. Hepatitis C  Blood testing is recommended for:  Everyone born from [birth year] through 1964.  Anyone with known risk factors for hepatitis C. Sexually transmitted infections (STIs)  You should be screened for sexually transmitted infections (STIs) including gonorrhea and chlamydia if:  You are sexually active and are younger than 58 years of age.  You are older than 58 years of age and your health care provider tells you that you are at risk for this type of infection.  Your sexual activity has changed since you were last screened and you are at an increased risk for chlamydia or gonorrhea. Ask your health care provider if you are at risk.  If you do not have HIV, but are at risk, it may be recommended that you take a prescription medicine daily to prevent HIV infection. This is called pre-exposure prophylaxis (PrEP). You are considered at risk if:  You are sexually active and do not regularly use condoms or know the HIV status of your partner(s).  You take drugs by injection.  You are sexually  active with a partner who has HIV. Talk with your health care provider about whether you are at high risk of being infected with HIV. If you choose to begin PrEP, you should first be tested for HIV. You should then be tested every 3 months for as long as you are taking PrEP.  PREGNANCY   If you are premenopausal and you may become pregnant, ask your health care provider about preconception counseling.  If you may  become pregnant, take 400 to 800 micrograms (mcg) of folic acid every day.  If you want to prevent pregnancy, talk to your health care provider about birth control (contraception). OSTEOPOROSIS AND MENOPAUSE   Osteoporosis is a disease in which the bones lose minerals and strength with aging. This can result in serious bone fractures. Your risk for osteoporosis can be identified using a bone density scan.  If you are 61 years of age or older, or if you are at risk for osteoporosis and fractures, ask your health care provider if you should be screened.  Ask your health care provider whether you should take a calcium or vitamin D supplement to lower your risk for osteoporosis.  Menopause may have certain physical symptoms and risks.  Hormone replacement therapy may reduce some of these symptoms and risks. Talk to your health care provider about whether hormone replacement therapy is right for you.  HOME CARE INSTRUCTIONS   Schedule regular health, dental, and eye exams.  Stay current with your immunizations.   Do not use any tobacco products including cigarettes, chewing tobacco, or electronic cigarettes.  If you are pregnant, do not drink alcohol.  If you are breastfeeding, limit how much and how often you drink alcohol.  Limit alcohol intake to no more than 1 drink per day for nonpregnant women. One drink equals 12 ounces of beer, 5 ounces of wine, or 1 ounces of hard liquor.  Do not use street drugs.  Do not share needles.  Ask your health care provider for help if  you need support or information about quitting drugs.  Tell your health care provider if you often feel depressed.  Tell your health care provider if you have ever been abused or do not feel safe at home.   This information is not intended to replace advice given to you by your health care provider. Make sure you discuss any questions you have with your health care provider.   Document Released: 12/19/2010 Document Revised: 06/26/2014 Document Reviewed: 05/07/2013 Elsevier Interactive Patient Education [employer].

## 2015-04-02 ENCOUNTER — Encounter: Payer: Self-pay | Admitting: Family Medicine

## 2015-04-02 DIAGNOSIS — E785 Hyperlipidemia, unspecified: Secondary | ICD-10-CM | POA: Insufficient documentation

## 2015-05-17 ENCOUNTER — Encounter: Payer: Self-pay | Admitting: Family Medicine

## 2015-05-17 ENCOUNTER — Ambulatory Visit (INDEPENDENT_AMBULATORY_CARE_PROVIDER_SITE_OTHER): Payer: Self-pay | Admitting: Family Medicine

## 2015-05-17 VITALS — BP 144/89 | HR 54 | Temp 97.7°F | Resp 18 | Ht 63.0 in | Wt 187.0 lb

## 2015-05-17 DIAGNOSIS — J0100 Acute maxillary sinusitis, unspecified: Secondary | ICD-10-CM

## 2015-05-17 DIAGNOSIS — J320 Chronic maxillary sinusitis: Secondary | ICD-10-CM | POA: Insufficient documentation

## 2015-05-17 DIAGNOSIS — R062 Wheezing: Secondary | ICD-10-CM

## 2015-05-17 MED ORDER — IPRATROPIUM BROMIDE 0.02 % IN SOLN
0.5000 mg | Freq: Once | RESPIRATORY_TRACT | Status: AC
  Administered 2015-05-17: 0.5 mg via RESPIRATORY_TRACT

## 2015-05-17 MED ORDER — ALBUTEROL SULFATE (2.5 MG/3ML) 0.083% IN NEBU
2.5000 mg | INHALATION_SOLUTION | Freq: Once | RESPIRATORY_TRACT | Status: AC
  Administered 2015-05-17: 2.5 mg via RESPIRATORY_TRACT

## 2015-05-17 MED ORDER — PREDNISONE 50 MG PO TABS: ORAL_TABLET | ORAL | Status: AC

## 2015-05-17 MED ORDER — DOXYCYCLINE HYCLATE 100 MG PO TABS: 100.0000 mg | ORAL_TABLET | Freq: Two times a day (BID) | ORAL | Status: AC

## 2015-05-17 NOTE — Patient Instructions (Addendum)
Sinusitis, Adult Sinusitis is redness, soreness, and inflammation of the paranasal sinuses. Paranasal sinuses are air pockets within the bones of your face. They are located beneath your eyes, in the middle of your forehead, and above your eyes. In healthy paranasal sinuses, mucus is able to drain out, and air is able to circulate through them by way of your nose. However, when your paranasal sinuses are inflamed, mucus and air can become trapped. This can allow bacteria and other germs to grow and cause infection. Sinusitis can develop quickly and last only a short time (acute) or continue over a long period (chronic). Sinusitis that lasts for more than 12 weeks is considered chronic. CAUSES Causes of sinusitis include:  Allergies.  Structural abnormalities, such as displacement of the cartilage that separates your nostrils (deviated septum), which can decrease the air flow through your nose and sinuses and affect sinus drainage.  Functional abnormalities, such as when the small hairs (cilia) that line your sinuses and help remove mucus do not work properly or are not present. SIGNS AND SYMPTOMS Symptoms of acute and chronic sinusitis are the same. The primary symptoms are pain and pressure around the affected sinuses. Other symptoms include:  Upper toothache.  Earache.  Headache.  Bad breath.  Decreased sense of smell and taste.  A cough, which worsens when you are lying flat.  Fatigue.  Fever.  Thick drainage from your nose, which often is green and may contain pus (purulent).  Swelling and warmth over the affected sinuses. DIAGNOSIS Your health care provider will perform a physical exam. During your exam, your health care provider may perform any of the following to help determine if you have acute sinusitis or chronic sinusitis:  Look in your nose for signs of abnormal growths in your nostrils (nasal polyps).  Tap over the affected sinus to check for signs of  infection.  View the inside of your sinuses using an imaging device that has a light attached (endoscope). If your health care provider suspects that you have chronic sinusitis, one or more of the following tests may be recommended:  Allergy tests.  Nasal culture. A sample of mucus is taken from your nose, sent to a lab, and screened for bacteria.  Nasal cytology. A sample of mucus is taken from your nose and examined by your health care provider to determine if your sinusitis is related to an allergy. TREATMENT Most cases of acute sinusitis are related to a viral infection and will resolve on their own within 10 days. Sometimes, medicines are prescribed to help relieve symptoms of both acute and chronic sinusitis. These may include pain medicines, decongestants, nasal steroid sprays, or saline sprays. However, for sinusitis related to a bacterial infection, your health care provider will prescribe antibiotic medicines. These are medicines that will help kill the bacteria causing the infection. Rarely, sinusitis is caused by a fungal infection. In these cases, your health care provider will prescribe antifungal medicine. For some cases of chronic sinusitis, surgery is needed. Generally, these are cases in which sinusitis recurs more than 3 times per year, despite other treatments. HOME CARE INSTRUCTIONS  Drink plenty of water. Water helps thin the mucus so your sinuses can drain more easily.  Use a humidifier.  Inhale steam 3-4 times a day (for example, sit in the bathroom with the shower running).  Apply a warm, moist washcloth to your face 3-4 times a day, or as directed by your health care provider.  Use saline nasal sprays to help   moisten and clean your sinuses.  Take medicines only as directed by your health care provider.  If you were prescribed either an antibiotic or antifungal medicine, finish it all even if you start to feel better. SEEK IMMEDIATE MEDICAL CARE IF:  You have  increasing pain or severe headaches.  You have nausea, vomiting, or drowsiness.  You have swelling around your face.  You have vision problems.  You have a stiff neck.  You have difficulty breathing.   This information is not intended to replace advice given to you by your health care provider. Make sure you discuss any questions you have with your health care provider.   Document Released: 06/05/2005 Document Revised: 06/26/2014 Document Reviewed: 06/20/2011 Elsevier Interactive Patient Education [employer]    continue mucinex, nasal saline.  - start antibiotic and steroid.  - rest and hydrate. Tylenol/advil for pain/fever

## 2015-05-17 NOTE — Progress Notes (Signed)
Pre visit review using our clinic review tool, if applicable. No additional management support is needed unless otherwise documented below in the visit note. 

## 2015-05-17 NOTE — Progress Notes (Signed)
   Subjective:    Patient ID: Caitlin Walters, female    DOB: 01-13-1956, 59 y.o.   MRN: [id number]  HPI  Chest congestion: Patient states approximately 10 days ago she started with some chest congestion. She has been visiting her grandchildren over this time and feels she first picked up a virus through them. She endorses mild fever, decreased appetite, pressure in bother her ears and face. She denies nausea, vomit, diarrhea or rash. She has been using mucinex and nasal saline without relief. She states she has no sense of smell or taste.   No sense of taste or smell. No h/o of asthma or COPD.  Never smoker  Past Medical History  Diagnosis Date  . Hypertension   . Herpes ocular   . Diverticula of colon 2013   Allergies  Allergen Reactions  . Penicillins Swelling    Review of Systems Negative, with the exception of above mentioned in HPI     Objective:   Physical Exam BP 144/89 mmHg  Pulse 54  Temp(Src) 97.7 F (36.5 C) (Temporal)  Resp 18  Ht 5\' 3"  (1.6 m)  Wt 187 lb (84.823 kg)  BMI 33.13 kg/m2  SpO2 96% Gen: Afebrile. No acute distress. Nontoxic in appearance. Fatigued appearing. Caucasian female.  HENT: AT. Aline. Bilateral TM visualized, shiny/full, no erythema or bulging. MMM, no oral lesions.  Bilateral nares with erythema and swelling. Throat with mild erythema, no exudates. TTP max sinus bilateral. Cough and hoarsness present on exam.  Eyes:Pupils Equal Round Reactive to light, Extraocular movements intact,  Conjunctiva without redness, discharge or icterus. Neck/lymp/endocrine: Supple, mild ant cervical lymphadenopathy right CV: RRR  Chest: mild diffuse wheezing. No crackles or rhonchi.  Skin: No rashes, purpura or petechiae.  Neuro: Normal gait. PERLA. EOMi. Alert. Oriented x3.      Assessment & Plan:  1. Acute maxillary sinusitis, recurrence not specified - continue mucinex, nasal saline.  - start antibiotic and steroid.  - rest and hydrate. Tylenol/advil  for pain/fever - predniSONE (DELTASONE) 50 MG tablet; 50 Mg QD with meals  Dispense: 5 tablet; Refill: 0 - doxycycline (VIBRA-TABS) 100 MG tablet; Take 1 tablet (100 mg total) by mouth 2 (two) times daily.  Dispense: 20 tablet; Refill: 0  2. Wheeze - breathing treatment in office today.  - Good improvement with treatment.   F/U PRN

## 2015-07-14 ENCOUNTER — Other Ambulatory Visit: Payer: Self-pay | Admitting: *Deleted

## 2015-07-14 DIAGNOSIS — I10 Essential (primary) hypertension: Secondary | ICD-10-CM

## 2015-07-14 MED ORDER — PROPRANOLOL HCL 20 MG PO TABS: 20.0000 mg | ORAL_TABLET | Freq: Two times a day (BID) | ORAL | Status: DC

## 2015-07-14 NOTE — Telephone Encounter (Signed)
Inderal refilled per refill protocol

## 2015-08-30 ENCOUNTER — Other Ambulatory Visit: Payer: Self-pay | Admitting: Family Medicine

## 2015-08-30 DIAGNOSIS — N6320 Unspecified lump in the left breast, unspecified quadrant: Secondary | ICD-10-CM

## 2015-09-27 ENCOUNTER — Ambulatory Visit
Admission: RE | Admit: 2015-09-27 | Discharge: 2015-09-27 | Disposition: A | Discharge status: Home / Self Care | Payer: No Typology Code available for payment source | Source: Ambulatory Visit | Attending: Family Medicine | Admitting: Family Medicine

## 2015-09-27 DIAGNOSIS — R928 Other abnormal and inconclusive findings on diagnostic imaging of breast: Secondary | ICD-10-CM

## 2015-09-27 DIAGNOSIS — N6320 Unspecified lump in the left breast, unspecified quadrant: Secondary | ICD-10-CM
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
## 2016-01-03 ENCOUNTER — Other Ambulatory Visit: Payer: Self-pay | Admitting: *Deleted

## 2016-01-03 DIAGNOSIS — I10 Essential (primary) hypertension: Secondary | ICD-10-CM

## 2016-01-03 MED ORDER — PROPRANOLOL HCL 20 MG PO TABS: 20.0000 mg | ORAL_TABLET | Freq: Two times a day (BID) | ORAL | Status: AC

## 2016-01-03 NOTE — Telephone Encounter (Signed)
Inderal refilled.
